# Patient Record
Sex: Male | Born: 1975
Health system: Southern US, Community
[De-identification: ages and names within clinical notes are randomized; demographics above are authoritative.]

## PROBLEM LIST (undated history)

## (undated) DIAGNOSIS — G43909 Migraine, unspecified, not intractable, without status migrainosus: Secondary | ICD-10-CM

## (undated) DIAGNOSIS — E785 Hyperlipidemia, unspecified: Secondary | ICD-10-CM

## (undated) HISTORY — DX: Hyperlipidemia, unspecified: E78.5

## (undated) HISTORY — DX: Migraine, unspecified, not intractable, without status migrainosus: G43.909

---

## 2010-08-04 ENCOUNTER — Encounter: Payer: Self-pay | Admitting: Preventative Medicine

## 2012-12-29 ENCOUNTER — Ambulatory Visit (INDEPENDENT_AMBULATORY_CARE_PROVIDER_SITE_OTHER): Payer: BC Managed Care – PPO | Admitting: Family Medicine

## 2012-12-29 ENCOUNTER — Encounter: Payer: Self-pay | Admitting: Family Medicine

## 2012-12-29 VITALS — BP 123/78 | HR 69 | Temp 98.3°F | Ht 67.0 in | Wt 169.2 lb

## 2012-12-29 DIAGNOSIS — Z0000 Encounter for general adult medical examination without abnormal findings: Secondary | ICD-10-CM

## 2012-12-29 DIAGNOSIS — R7309 Other abnormal glucose: Secondary | ICD-10-CM

## 2012-12-29 DIAGNOSIS — R7989 Other specified abnormal findings of blood chemistry: Secondary | ICD-10-CM

## 2012-12-29 DIAGNOSIS — E790 Hyperuricemia without signs of inflammatory arthritis and tophaceous disease: Secondary | ICD-10-CM

## 2012-12-29 DIAGNOSIS — R739 Hyperglycemia, unspecified: Secondary | ICD-10-CM

## 2012-12-29 NOTE — Progress Notes (Signed)
  Subjective:    Patient ID: Ronald Medina, male    DOB: 1976-01-28, 37 y.o.   MRN: [id number]  HPI This 37 y.o. male presents for evaluation of  Work physical. He works at [employer].  He has had labs and his potassium is mildly elevated at 5.2, his ggt is slightly elevated at 66, and his uric acid is elevated at 7.7.  He does admit to drinking 2 beers a day.  He does not have any other medical problems.  He is otherwise healthy.   Review of Systems  Constitutional: Negative.   HENT: Negative.   Eyes: Negative.   Respiratory: Negative.   Cardiovascular: Negative.   Gastrointestinal: Negative.   Genitourinary: Negative.   Musculoskeletal: Negative.        Objective:   Physical Exam  Vital signs noted  Well developed well nourished male.  HEENT - Head atraumatic Normocephalic                Eyes - PERRLA, Conjuctiva - clear Sclera- Clear EOMI                Ears - EAC's Wnl TM's Wnl Gross Hearing WNL                Nose - Nares patent                 Throat - oropharanx wnl Respiratory - Lungs CTA bilateral Cardiac - RRR S1 and S2 without murmur GI - Abdomen soft Nontender and bowel sounds active x 4 Extremities - No edema. Neuro - Grossly intact.      Assessment & Plan:  Routine general medical examination at a health care facility Recommend he get annual exam and labs.  Recommend annual dental exam and eye exam.  Hyperuricemia- DC ETOH for a week and repeat Uric acid and follow low purine diet.  Hyperglycemia - Repeat fasting glucose  Elevated LFTs - Abstain from ETOH for week and repeat LFT and GGT.  Hyperkalemia - Repeat CMP in a week.

## 2012-12-29 NOTE — Patient Instructions (Signed)
Hyperkalemia  Hyperkalemia is when you have too much potassium in your blood. This can be a life-threatening condition. Potassium is normally removed (excreted) from the body by the kidneys.  CAUSES   The potassium level in your body can become too high for the following reasons:   You take in too much potassium. You can do this by:   Using salt substitutes. They contain large amounts of potassium.   Taking potassium supplements from your caregiver. The dose may be too high for you.   Eating foods or taking nutritional products with potassium.   You excrete too little potassium. This can happen if:   Your kidneys are not functioning properly. Kidney (renal) disease is a very common cause of hyperkalemia.   You are taking medicines that lower your excretion of potassium, such as certain diuretic medicines.   You have an adrenal gland disease called Addison's disease.   You have a urinary tract obstruction, such as kidney stones.   You are on treatment to mechanically clean your blood (dialysis) and you skip a treatment.   You release a high amount of potassium from your cells into your blood. You may have a condition that causes potassium to move from your cells to your bloodstream. This can happen with:   Injury to muscles or other tissues. Most potassium is stored in the muscles.   Severe burns or infections.   Acidic blood plasma (acidosis). Acidosis can result from many diseases, such as uncontrolled diabetes.  SYMPTOMS   Usually, there are no symptoms unless the potassium is dangerously high or has risen very quickly. Symptoms may include:   Irregular or very slow heartbeat.   Feeling sick to your stomach (nauseous).   Tiredness (fatigue).   Nerve problems such as tingling of the skin, numbness of the hands or feet, weakness, or paralysis.  DIAGNOSIS   A simple blood test can measure the amount of potassium in your body. An electrocardiogram test of the heart can also help make the diagnosis.  The heart may beat dangerously fast or slow down and stop beating with severe hyperkalemia.   TREATMENT   Treatment depends on how bad the condition is and on the underlying cause.   If the hyperkalemia is an emergency (causing heart problems or paralysis), many different medicines can be used alone or together to lower the potassium level briefly. This may include an insulin injection even if you are not diabetic. Emergency dialysis may be needed to remove potassium from the body.   If the hyperkalemia is less severe or dangerous, the underlying cause is treated. This can include taking medicines if needed. Your prescription medicines may be changed. You may also need to take a medicine to help your body get rid of potassium. You may need to eat a diet low in potassium.  HOME CARE INSTRUCTIONS    Take medicines and supplements as directed by your caregiver.   Do not take any over-the-counter medicines, supplements, natural products, herbs, or vitamins without reviewing them with your caregiver. Certain supplements and natural food products can have high amounts of potassium. Other products (such as ibuprofen) can damage weak kidneys and raise your potassium.   You may be asked to do repeat lab tests. Be sure to follow these directions.   If you have kidney disease, you may need to follow a low potassium diet.  SEEK MEDICAL CARE IF:    You notice an irregular or very slow heartbeat.   You feel lightheaded.     You develop weakness that is unusual for you.  SEEK IMMEDIATE MEDICAL CARE IF:    You have shortness of breath.   You have chest discomfort.   You pass out (faint).  MAKE SURE YOU:    Understand these instructions.   Will watch your condition.   Will get help right away if you are not doing well or get worse.  Document Released: 06/20/2002 Document Revised: 09/22/2011 Document Reviewed: 12/05/2010  [employer] Patient Information 2014 [employer].

## 2014-06-14 ENCOUNTER — Encounter: Payer: Self-pay | Admitting: Family Medicine

## 2014-06-14 ENCOUNTER — Ambulatory Visit (INDEPENDENT_AMBULATORY_CARE_PROVIDER_SITE_OTHER): Payer: BC Managed Care – PPO | Admitting: Family Medicine

## 2014-06-14 ENCOUNTER — Encounter (INDEPENDENT_AMBULATORY_CARE_PROVIDER_SITE_OTHER): Payer: Self-pay

## 2014-06-14 VITALS — BP 135/87 | HR 60 | Temp 97.5°F | Ht 67.0 in | Wt 190.8 lb

## 2014-06-14 DIAGNOSIS — Z0000 Encounter for general adult medical examination without abnormal findings: Secondary | ICD-10-CM

## 2014-06-14 NOTE — Progress Notes (Signed)
   Subjective:    Patient ID: Ronald Medina, male    DOB: 07/18/1975, 38 y.o.   MRN: [id number]  HPI 38 year old gentleman here for annual physical. He brings in some lab work that he had done at work basically it's okay except for elevated uric acid and his SGPT is mildly elevated with other liver function enzymes being normal. He has no complaints. He does have a family history of hypertension.    Review of Systems  Constitutional: Negative.   HENT: Negative.   Eyes: Negative.   Respiratory: Negative.  Negative for shortness of breath.   Cardiovascular: Negative.  Negative for chest pain and leg swelling.  Gastrointestinal: Negative.   Genitourinary: Negative.   Musculoskeletal: Negative.   Skin: Negative.   Neurological: Negative.   Psychiatric/Behavioral: Negative.   All other systems reviewed and are negative.      Objective:   Physical Exam  Constitutional: He is oriented to person, place, and time. He appears well-developed and well-nourished.  HENT:  Head: Normocephalic.  Right Ear: External ear normal.  Left Ear: External ear normal.  Nose: Nose normal.  Mouth/Throat: Oropharynx is clear and moist.  Eyes: Conjunctivae and EOM are normal. Pupils are equal, round, and reactive to light.  Neck: Normal range of motion. Neck supple.  Cardiovascular: Normal rate, regular rhythm, normal heart sounds and intact distal pulses.   Pulmonary/Chest: Effort normal and breath sounds normal.  Abdominal: Soft. Bowel sounds are normal.  Musculoskeletal: Normal range of motion.  Neurological: He is alert and oriented to person, place, and time.  Skin: Skin is warm and dry.  Psychiatric: He has a normal mood and affect. His behavior is normal. Judgment and thought content normal.          Assessment & Plan:  BP 135/87 mmHg  Pulse 60  Temp(Src) 97.5 F (36.4 C) (Oral)  Ht 5\' 7"  (1.702 m)  Wt 190 lb 12.8 oz (86.546 kg)  BMI 29.88 kg/m2  1. Annual physical exam Normal exam but  rec monitoring BP.  If he develops gout, may want to consider uricosuric drug  Frederica KusterStephen M Mehlani Blankenburg MD

## 2015-01-04 ENCOUNTER — Encounter: Payer: Self-pay | Admitting: Family Medicine

## 2015-01-04 ENCOUNTER — Ambulatory Visit (INDEPENDENT_AMBULATORY_CARE_PROVIDER_SITE_OTHER): Payer: BLUE CROSS/BLUE SHIELD | Admitting: Family Medicine

## 2015-01-04 VITALS — BP 130/81 | HR 60 | Temp 97.8°F | Ht 67.0 in | Wt 185.4 lb

## 2015-01-04 DIAGNOSIS — R519 Headache, unspecified: Secondary | ICD-10-CM

## 2015-01-04 DIAGNOSIS — R51 Headache: Secondary | ICD-10-CM

## 2015-01-04 MED ORDER — FROVATRIPTAN SUCCINATE 2.5 MG PO TABS: 2.5000 mg | ORAL_TABLET | ORAL | Status: DC | PRN

## 2015-01-04 MED ORDER — TOPIRAMATE 100 MG PO TABS: 100.0000 mg | ORAL_TABLET | Freq: Two times a day (BID) | ORAL | Status: DC

## 2015-01-04 MED ORDER — TOPIRAMATE 100 MG PO TABS: 100.0000 mg | ORAL_TABLET | Freq: Every day | ORAL | Status: DC

## 2015-01-04 NOTE — Progress Notes (Signed)
Subjective:  Patient ID: Ronald Medina, male    DOB: 10-05-1975  Age: 39 y.o. MRN: [id number]  CC: Headache   HPI Ronald Medina presents for first cluster in 7-8 yrs. Onset 2 weeks ago. Occur q 3-4 day. Alcohol triggers during a cluster.Poor relief in past with imitrex. R hemicranium. 7/10 pain scale. Last several hours. HEad sore the next day. Light headed. History Ronald Medina has no past medical history on file.   He has no past surgical history on file.   His family history includes Healthy in his brother, father, mother, and sister.He reports that he has quit smoking. He does not have any smokeless tobacco history on file. He reports that he drinks alcohol. He reports that he does not use illicit drugs.  No outpatient prescriptions prior to visit.   No facility-administered medications prior to visit.    ROS Review of Systems  Constitutional: Negative for fever, chills, diaphoresis and unexpected weight change.  HENT: Negative for congestion, hearing loss, rhinorrhea, sore throat and trouble swallowing.   Gastrointestinal: Negative for nausea, vomiting, abdominal pain, diarrhea, constipation and abdominal distention.  Endocrine: Negative for heat intolerance.  Genitourinary: Negative for dysuria, hematuria and flank pain.  Musculoskeletal: Negative for joint swelling and arthralgias.  Skin: Negative for rash.  Neurological: Negative for dizziness and headaches.  Psychiatric/Behavioral: Negative for dysphoric mood, decreased concentration and agitation. The patient is not nervous/anxious.     Objective:  BP 130/81 mmHg  Pulse 60  Temp(Src) 97.8 F (36.6 C) (Oral)  Ht  (1.702 m)  Wt 185 lb 6.4 oz (84.097 kg)  BMI 29.03 kg/m2  BP Readings from Last 3 Encounters:  01/04/15 130/81  06/14/14 135/87  12/29/12 123/78    Wt Readings from Last 3 Encounters:  01/04/15 185 lb 6.4 oz (84.097 kg)  06/14/14 190 lb 12.8 oz (86.546 kg)  12/29/12 169 lb 3.2 oz (76.749 kg)      Physical Exam  Constitutional: He is oriented to person, place, and time. He appears well-developed and well-nourished. No distress.  HENT:  Head: Normocephalic and atraumatic.  Right Ear: External ear normal.  Left Ear: External ear normal.  Nose: Nose normal.  Mouth/Throat: Oropharynx is clear and moist.  Eyes: Conjunctivae and EOM are normal. Pupils are equal, round, and reactive to light.  Neck: Normal range of motion. Neck supple. No thyromegaly present.  Cardiovascular: Normal rate, regular rhythm and normal heart sounds.   No murmur heard. Pulmonary/Chest: Effort normal and breath sounds normal. No respiratory distress. He has no wheezes. He has no rales.  Abdominal: Soft. Bowel sounds are normal. He exhibits no distension. There is no tenderness.  Lymphadenopathy:    He has no cervical adenopathy.  Neurological: He is alert and oriented to person, place, and time. He has normal reflexes.  Skin: Skin is warm and dry.  Psychiatric: He has a normal mood and affect. His behavior is normal. Judgment and thought content normal.    No results found for: HGBA1C  No results found for: WBC, HGB, HCT, PLT, GLUCOSE, CHOL, TRIG, HDL, LDLDIRECT, LDLCALC, ALT, AST, NA, K, CL, CREATININE, BUN, CO2, TSH, PSA, INR, GLUF, HGBA1C, MICROALBUR  Patient was never admitted.  Assessment & Plan:   Ronald Medina was seen today for headache.  Diagnoses and all orders for this visit:  Headache disorder  Other orders -     Discontinue: topiramate (TOPAMAX) 100 MG tablet; Take 1 tablet (100 mg total) by mouth 2 (two) times daily. -  frovatriptan (FROVA) 2.5 MG tablet; Take 1 tablet (2.5 mg total) by mouth as needed for migraine. If recurs, may repeat after 2 hours. Max of 3 tabs in 24 hours. -     topiramate (TOPAMAX) 100 MG tablet; Take 1 tablet (100 mg total) by mouth at bedtime.  I have changed Mr. Ronald Medina's topiramate. I am also having him start on frovatriptan.  Meds ordered this encounter   Medications  . DISCONTD: topiramate (TOPAMAX) 100 MG tablet    Sig: Take 1 tablet (100 mg total) by mouth 2 (two) times daily.    Dispense:  30 tablet    Refill:  2  . frovatriptan (FROVA) 2.5 MG tablet    Sig: Take 1 tablet (2.5 mg total) by mouth as needed for migraine. If recurs, may repeat after 2 hours. Max of 3 tabs in 24 hours.    Dispense:  10 tablet    Refill:  0  . topiramate (TOPAMAX) 100 MG tablet    Sig: Take 1 tablet (100 mg total) by mouth at bedtime.    Dispense:  30 tablet    Refill:  2    Cancel previous BID scrip. Thanks, WS     Follow-up: Return in about 1 month (around 02/03/2015).  Ronald Medina, M.D.

## 2015-01-10 ENCOUNTER — Other Ambulatory Visit: Payer: Self-pay | Admitting: Family Medicine

## 2015-04-25 ENCOUNTER — Ambulatory Visit (INDEPENDENT_AMBULATORY_CARE_PROVIDER_SITE_OTHER): Payer: BLUE CROSS/BLUE SHIELD | Admitting: Family Medicine

## 2015-04-25 ENCOUNTER — Encounter: Payer: Self-pay | Admitting: Family Medicine

## 2015-04-25 VITALS — BP 137/88 | HR 91 | Temp 97.2°F | Ht 67.0 in | Wt 191.6 lb

## 2015-04-25 DIAGNOSIS — J309 Allergic rhinitis, unspecified: Secondary | ICD-10-CM

## 2015-04-25 MED ORDER — MOMETASONE FUROATE 50 MCG/ACT NA SUSP: 1.0000 | Freq: Two times a day (BID) | NASAL | Status: DC | PRN

## 2015-04-25 NOTE — Progress Notes (Signed)
BP 137/88 mmHg  Pulse 91  Temp(Src) 97.2 F (36.2 C) (Oral)  Ht  (1.702 m)  Wt 191 lb 9.6 oz (86.909 kg)  BMI 30.00 kg/m2   Subjective:    Patient ID: Ronald Medina, male    DOB: [birth year], 39 y.o.   MRN: [id number]  HPI: Ronald Medina is a 39 y.o. male presenting on 04/25/2015 for Cough and Nasal Congestion   HPI Cough and nasal congestion Patient has been having cough and nasal congestion in the past 2 weeks. He also complains of sinus pressure and postnasal drainage and a tickling feeling in his throat that is worse at night and in the morning. He denies any fevers or chills and was wondering whether this would be allergies or another type of infection. Sinus pressure is mostly on the frontal region.  Relevant past medical, surgical, family and social history reviewed and updated as indicated. Interim medical history since our last visit reviewed. Allergies and medications reviewed and updated.  Review of Systems  Constitutional: Positive for fever. Negative for chills.  HENT: Positive for congestion, postnasal drip, rhinorrhea, sinus pressure, sneezing and sore throat. Negative for ear discharge, ear pain and voice change.   Eyes: Negative for pain, discharge, redness and visual disturbance.  Respiratory: Positive for cough. Negative for chest tightness, shortness of breath and wheezing.   Cardiovascular: Negative for chest pain and leg swelling.  Gastrointestinal: Negative for abdominal pain, diarrhea and constipation.  Genitourinary: Negative for difficulty urinating.  Musculoskeletal: Negative for back pain and gait problem.  Skin: Negative for rash.  Neurological: Negative for syncope, light-headedness and headaches.  All other systems reviewed and are negative.   Per HPI unless specifically indicated above     Medication List       This list is accurate as of: 04/25/15  4:28 PM.  Always use your most recent med list.               frovatriptan 2.5 MG  tablet  Commonly known as:  FROVA  TAKE 1 TAB BY MOUTH AS NEEDED FOR MIGRAINE. IF RECURS, MAY REPEAT IN 2HOURS. MAX OF 3TABS IN 24HOURS     mometasone 50 MCG/ACT nasal spray  Commonly known as:  NASONEX  Place 1 spray into the nose 2 (two) times daily as needed.           Objective:    BP 137/88 mmHg  Pulse 91  Temp(Src) 97.2 F (36.2 C) (Oral)  Ht  (1.702 m)  Wt 191 lb 9.6 oz (86.909 kg)  BMI 30.00 kg/m2  Wt Readings from Last 3 Encounters:  04/25/15 191 lb 9.6 oz (86.909 kg)  01/04/15 185 lb 6.4 oz (84.097 kg)  06/14/14 190 lb 12.8 oz (86.546 kg)    Physical Exam  Constitutional: He is oriented to person, place, and time. He appears well-developed and well-nourished. No distress.  HENT:  Right Ear: Tympanic membrane, external ear and ear canal normal.  Left Ear: Tympanic membrane, external ear and ear canal normal.  Nose: Mucosal edema and rhinorrhea present. No sinus tenderness. No epistaxis. Right sinus exhibits frontal sinus tenderness. Right sinus exhibits no maxillary sinus tenderness. Left sinus exhibits frontal sinus tenderness. Left sinus exhibits no maxillary sinus tenderness.  Mouth/Throat: Uvula is midline and mucous membranes are normal. Posterior oropharyngeal edema and posterior oropharyngeal erythema present. No oropharyngeal exudate or tonsillar abscesses.  Eyes: Conjunctivae and EOM are normal. Pupils are equal, round, and reactive to light. Right  eye exhibits no discharge. No scleral icterus.  Neck: Neck supple. No thyromegaly present.  Cardiovascular: Normal rate, regular rhythm, normal heart sounds and intact distal pulses.   No murmur heard. Pulmonary/Chest: Effort normal and breath sounds normal. No respiratory distress. He has no wheezes.  Abdominal: He exhibits no distension.  Musculoskeletal: Normal range of motion. He exhibits no edema.  Lymphadenopathy:    He has no cervical adenopathy.  Neurological: He is alert and oriented to person,  place, and time. Coordination normal.  Skin: Skin is warm and dry. No rash noted. He is not diaphoretic.  Psychiatric: He has a normal mood and affect. His behavior is normal.  Vitals reviewed.   No results found for this or any previous visit.    Assessment & Plan:   Problem List Items Addressed This Visit    None    Visit Diagnoses    Allergic rhinitis, unspecified allergic rhinitis type    -  Primary    Sounds like allergic rhinitis especially with the season. We'll try Nasonex and an antihistamine    Relevant Medications    mometasone (NASONEX) 50 MCG/ACT nasal spray        Follow up plan: Return in about 1 week (around 05/02/2015), or if symptoms worsen or fail to improve, for Erectile dysfunction.  Arville CareJoshua Dettinger, MD [facility] 04/25/2015, 4:28 PM

## 2015-04-30 ENCOUNTER — Ambulatory Visit (INDEPENDENT_AMBULATORY_CARE_PROVIDER_SITE_OTHER): Payer: BLUE CROSS/BLUE SHIELD | Admitting: Family Medicine

## 2015-04-30 ENCOUNTER — Encounter: Payer: Self-pay | Admitting: Family Medicine

## 2015-04-30 VITALS — BP 131/81 | HR 89 | Temp 97.2°F | Ht 67.0 in | Wt 189.4 lb

## 2015-04-30 DIAGNOSIS — R7301 Impaired fasting glucose: Secondary | ICD-10-CM | POA: Diagnosis not present

## 2015-04-30 DIAGNOSIS — N529 Male erectile dysfunction, unspecified: Secondary | ICD-10-CM | POA: Diagnosis not present

## 2015-04-30 DIAGNOSIS — E785 Hyperlipidemia, unspecified: Secondary | ICD-10-CM | POA: Diagnosis not present

## 2015-04-30 LAB — POCT GLYCOSYLATED HEMOGLOBIN (HGB A1C): HEMOGLOBIN A1C: 5.2

## 2015-04-30 MED ORDER — SILDENAFIL CITRATE 100 MG PO TABS: 50.0000 mg | ORAL_TABLET | Freq: Every day | ORAL | Status: DC | PRN

## 2015-04-30 MED ORDER — ATORVASTATIN CALCIUM 20 MG PO TABS: 20.0000 mg | ORAL_TABLET | Freq: Every day | ORAL | Status: DC

## 2015-04-30 NOTE — Progress Notes (Signed)
BP 131/81 mmHg  Pulse 89  Temp(Src) 97.2 F (36.2 C) (Oral)  Ht  (1.702 m)  Wt 189 lb 6.4 oz (85.911 kg)  BMI 29.66 kg/m2   Subjective:    Patient ID: Ronald Medina, male    DOB: [birth year], 39 y.o.   MRN: [id number]  HPI: Ronald Medina is a 39 y.o. male presenting on 04/30/2015 for Followup URI   HPI Elevated fasting glucose  Patient presents today with elevated fasting glucose of 109. He had his labs done through his work and he brings that sheet in to be reviewed by me today. He also had labs testing for kidney function, liver function, thyroid, prostate and they were all normal. He denies any polyuria or dysuria or chest pain or shortness of breath.  Erectile dysfunction Patient has been having difficulty with erections, he can sometimes get an erection and sometimes not. His biggest issue though is he has trouble maintaining erections. He would like to try a medication to help with that. He denies any penile or testicular pain or penile discharge.  Hyperlipidemia Patient has elevated LDL in the 170s on his labs, his triglycerides were also in the 180s. From these labs realize he needs to do some dietary changes and lifestyle changes such as exercise. He does have a family history of heart disease in some of his uncles but not in his immediate family.  Relevant past medical, surgical, family and social history reviewed and updated as indicated. Interim medical history since our last visit reviewed. Allergies and medications reviewed and updated.  Review of Systems  Constitutional: Negative for fever.  HENT: Negative for ear discharge and ear pain.   Eyes: Negative for discharge and visual disturbance.  Respiratory: Negative for shortness of breath and wheezing.   Cardiovascular: Negative for chest pain and leg swelling.  Gastrointestinal: Negative for abdominal pain, diarrhea and constipation.  Genitourinary: Negative for discharge, penile swelling, difficulty urinating,  genital sores, penile pain and testicular pain.  Musculoskeletal: Negative for back pain and gait problem.  Skin: Negative for rash.  Neurological: Negative for syncope, light-headedness and headaches.  All other systems reviewed and are negative.   Per HPI unless specifically indicated above     Medication List       This list is accurate as of: 04/30/15  6:05 PM.  Always use your most recent med list.               atorvastatin 20 MG tablet  Commonly known as:  LIPITOR  Take 1 tablet (20 mg total) by mouth daily.     frovatriptan 2.5 MG tablet  Commonly known as:  FROVA  TAKE 1 TAB BY MOUTH AS NEEDED FOR MIGRAINE. IF RECURS, MAY REPEAT IN 2HOURS. MAX OF 3TABS IN 24HOURS     mometasone 50 MCG/ACT nasal spray  Commonly known as:  NASONEX  Place 1 spray into the nose 2 (two) times daily as needed.     sildenafil 100 MG tablet  Commonly known as:  VIAGRA  Take 0.5-1 tablets (50-100 mg total) by mouth daily as needed for erectile dysfunction.           Objective:    BP 131/81 mmHg  Pulse 89  Temp(Src) 97.2 F (36.2 C) (Oral)  Ht  (1.702 m)  Wt 189 lb 6.4 oz (85.911 kg)  BMI 29.66 kg/m2  Wt Readings from Last 3 Encounters:  04/30/15 189 lb 6.4 oz (85.911 kg)  04/25/15 191  lb 9.6 oz (86.909 kg)  01/04/15 185 lb 6.4 oz (84.097 kg)    Physical Exam  Constitutional: He is oriented to person, place, and time. He appears well-developed and well-nourished. No distress.  Eyes: Conjunctivae and EOM are normal. Pupils are equal, round, and reactive to light. Right eye exhibits no discharge. No scleral icterus.  Cardiovascular: Normal rate, regular rhythm, normal heart sounds and intact distal pulses.   No murmur heard. Pulmonary/Chest: Effort normal and breath sounds normal. No respiratory distress. He has no wheezes.  Abdominal: He exhibits no distension.  Musculoskeletal: Normal range of motion. He exhibits no edema.  Neurological: He is alert and oriented to  person, place, and time. Coordination normal.  Skin: Skin is warm and dry. No rash noted. He is not diaphoretic.  Psychiatric: He has a normal mood and affect. His behavior is normal.  Vitals reviewed.   No results found for this or any previous visit.    Assessment & Plan:   Problem List Items Addressed This Visit    None    Visit Diagnoses    Elevated fasting glucose    -  Primary    Fasting glucose elevated on labs he did at work. We will check an A1c    Relevant Orders    POCT glycosylated hemoglobin (Hb A1C) (Completed)    Erectile dysfunction, unspecified erectile dysfunction type        Gave samples for Cialis but sent a prescription for Viagra and he will try them    Relevant Medications    sildenafil (VIAGRA) 100 MG tablet    Hyperlipidemia LDL goal <130        LDL and triglycerides elevated on labs he didn't work, will start atorvastatin    Relevant Medications    atorvastatin (LIPITOR) 20 MG tablet    sildenafil (VIAGRA) 100 MG tablet        Follow up plan: Return in about 6 months (around 10/29/2015), or if symptoms worsen or fail to improve, for hyperlipidemia.  Arville CareJoshua Dettinger, MD [facility] 04/30/2015, 6:05 PM

## 2015-05-23 ENCOUNTER — Ambulatory Visit (INDEPENDENT_AMBULATORY_CARE_PROVIDER_SITE_OTHER): Payer: BLUE CROSS/BLUE SHIELD | Admitting: Family Medicine

## 2015-05-23 VITALS — BP 136/89 | HR 81 | Temp 97.8°F | Ht 67.0 in | Wt 189.0 lb

## 2015-05-23 DIAGNOSIS — J208 Acute bronchitis due to other specified organisms: Secondary | ICD-10-CM

## 2015-05-23 MED ORDER — AZITHROMYCIN 250 MG PO TABS: ORAL_TABLET | ORAL | Status: DC

## 2015-05-23 MED ORDER — BENZONATATE 100 MG PO CAPS: 100.0000 mg | ORAL_CAPSULE | Freq: Two times a day (BID) | ORAL | Status: DC | PRN

## 2015-05-23 NOTE — Progress Notes (Signed)
   HPI  Patient presents today here for evaluation of cough.  Patient's had a cough over the last month. Has almost completely resolved and then returned over the last 4 days. He states he also has chest tightness and nasal congestion over the last 4 days. He had some improvement previously as an accident has used a little bit lately. He denies dyspnea, chest pain, fever, malaise. He has not been missing any work. His normal appetite and oral tolerance.  Describes a mostly dry cough with intermittent yellow sputum production  PMH: Smoking status noted ROS: Per HPI  Objective: BP 136/89 mmHg  Pulse 81  Temp(Src) 97.8 F (36.6 C) (Oral)  Ht 5\' 7"  (1.702 m)  Wt 189 lb (85.73 kg)  BMI 29.59 kg/m2 Gen: NAD, alert, cooperative with exam HEENT: NCAT, TMs normal bilaterally, nares clear, oropharynx clear CV: RRR, good S1/S2, no murmur Resp: CTABL, no wheezes, non-labored Ext: No edema, warm Neuro: Alert and oriented, No gross deficits  Assessment and plan:  # Acute bronchitis Considering resolution of previous cough and then return with chest tightness over the last 4-5 days ahead and treat as a possible bacterial process with azithromycin Discussed reasons to return for care including failure to improve. Recommended consistent Nasonex use Tessalon Perles for cough as well   Meds ordered this encounter  Medications  . azithromycin (ZITHROMAX) 250 MG tablet    Sig: Take 2 tablets on day 1 and 1 tablet daily after that    Dispense:  6 tablet    Refill:  0  . benzonatate (TESSALON) 100 MG capsule    Sig: Take 1 capsule (100 mg total) by mouth 2 (two) times daily as needed for cough.    Dispense:  20 capsule    Refill:  0    Murtis SinkSam Latissa Frick, MD [facility] 05/23/2015, 5:31 PM

## 2015-05-23 NOTE — Patient Instructions (Signed)
Great to meet you!  Take the antibiotics Use tessalon for cough  Use the nasonex everyday  Acute Bronchitis Bronchitis is inflammation of the airways that extend from the windpipe into the lungs (bronchi). The inflammation often causes mucus to develop. This leads to a cough, which is the most common symptom of bronchitis.  In acute bronchitis, the condition usually develops suddenly and goes away over time, usually in a couple weeks. Smoking, allergies, and asthma can make bronchitis worse. Repeated episodes of bronchitis may cause further lung problems.  CAUSES Acute bronchitis is most often caused by the same virus that causes a cold. The virus can spread from person to person (contagious) through coughing, sneezing, and touching contaminated objects. SIGNS AND SYMPTOMS   Cough.   Fever.   Coughing up mucus.   Body aches.   Chest congestion.   Chills.   Shortness of breath.   Sore throat.  DIAGNOSIS  Acute bronchitis is usually diagnosed through a physical exam. Your health care provider will also ask you questions about your medical history. Tests, such as chest X-rays, are sometimes done to rule out other conditions.  TREATMENT  Acute bronchitis usually goes away in a couple weeks. Oftentimes, no medical treatment is necessary. Medicines are sometimes given for relief of fever or cough. Antibiotic medicines are usually not needed but may be prescribed in certain situations. In some cases, an inhaler may be recommended to help reduce shortness of breath and control the cough. A cool mist vaporizer may also be used to help thin bronchial secretions and make it easier to clear the chest.  HOME CARE INSTRUCTIONS  Get plenty of rest.   Drink enough fluids to keep your urine clear or pale yellow (unless you have a medical condition that requires fluid restriction). Increasing fluids may help thin your respiratory secretions (sputum) and reduce chest congestion, and it  will prevent dehydration.   Take medicines only as directed by your health care provider.  If you were prescribed an antibiotic medicine, finish it all even if you start to feel better.  Avoid smoking and secondhand smoke. Exposure to cigarette smoke or irritating chemicals will make bronchitis worse. If you are a smoker, consider using nicotine gum or skin patches to help control withdrawal symptoms. Quitting smoking will help your lungs heal faster.   Reduce the chances of another bout of acute bronchitis by washing your hands frequently, avoiding people with cold symptoms, and trying not to touch your hands to your mouth, nose, or eyes.   Keep all follow-up visits as directed by your health care provider.  SEEK MEDICAL CARE IF: Your symptoms do not improve after 1 week of treatment.  SEEK IMMEDIATE MEDICAL CARE IF:  You develop an increased fever or chills.   You have chest pain.   You have severe shortness of breath.  You have bloody sputum.   You develop dehydration.  You faint or repeatedly feel like you are going to pass out.  You develop repeated vomiting.  You develop a severe headache. MAKE SURE YOU:   Understand these instructions.  Will watch your condition.  Will get help right away if you are not doing well or get worse.   This information is not intended to replace advice given to you by your health care provider. Make sure you discuss any questions you have with your health care provider.   Document Released: 08/07/2004 Document Revised: 07/21/2014 Document Reviewed: 12/21/2012 Elsevier Interactive Patient Education [employer]2016 Elsevier Inc.

## 2015-06-05 ENCOUNTER — Ambulatory Visit (INDEPENDENT_AMBULATORY_CARE_PROVIDER_SITE_OTHER): Payer: BLUE CROSS/BLUE SHIELD

## 2015-06-05 ENCOUNTER — Ambulatory Visit (INDEPENDENT_AMBULATORY_CARE_PROVIDER_SITE_OTHER): Payer: BLUE CROSS/BLUE SHIELD | Admitting: Family Medicine

## 2015-06-05 ENCOUNTER — Encounter: Payer: Self-pay | Admitting: Family Medicine

## 2015-06-05 ENCOUNTER — Encounter (INDEPENDENT_AMBULATORY_CARE_PROVIDER_SITE_OTHER): Payer: Self-pay

## 2015-06-05 VITALS — BP 140/79 | HR 104 | Temp 97.1°F | Ht 67.0 in | Wt 181.6 lb

## 2015-06-05 DIAGNOSIS — M79642 Pain in left hand: Secondary | ICD-10-CM | POA: Diagnosis not present

## 2015-06-05 DIAGNOSIS — M25561 Pain in right knee: Secondary | ICD-10-CM

## 2015-06-05 DIAGNOSIS — R030 Elevated blood-pressure reading, without diagnosis of hypertension: Secondary | ICD-10-CM | POA: Diagnosis not present

## 2015-06-05 DIAGNOSIS — E785 Hyperlipidemia, unspecified: Secondary | ICD-10-CM | POA: Diagnosis not present

## 2015-06-05 NOTE — Progress Notes (Signed)
Subjective:  Patient ID: Ronald Medina, male    DOB: 06/18/1976  Age: 39 y.o. MRN: [id number]  CC: Knee Pain   HPI Ronald Medina presents for recurrent right knee pain. Had a shot in it 3-4 years ago. Feels like a toothache. Tight in back. No XR done. Flares occasionally  In past and responds to ibuprofen. This time he has not had relief with OTC's. Pain is 6/10.   Had metal pully fall on left hand a month ago. Painful at first. Now he can't  Flex at 5th DIP left hand.Plays guitar. Can't play due to inability of 5th finger to flex.  Has had elevated BP on several occasions, up and down for a year or two. Doesn't eat right. Would like to avoid more medication. Willing to modify diet and lose weight.  Patient in for follow-up of elevated cholesterol. Doing well without complaints on current medication. Denies side effects of statin including myalgia and arthralgia and nausea. Also in today for liver function testing. Currently no chest pain, shortness of breath or other cardiovascular related symptoms noted.  History Ronald Medina has a past medical history of Migraines.   He has no past surgical history on file.   His family history includes Healthy in his brother, father, mother, and sister.He reports that he has quit smoking. He does not have any smokeless tobacco history on file. He reports that he drinks alcohol. He reports that he does not use illicit drugs.  Outpatient Prescriptions Prior to Visit  Medication Sig Dispense Refill  . atorvastatin (LIPITOR) 20 MG tablet Take 1 tablet (20 mg total) by mouth daily. 90 tablet 3  . mometasone (NASONEX) 50 MCG/ACT nasal spray Place 1 spray into the nose 2 (two) times daily as needed. 17 g 12  . sildenafil (VIAGRA) 100 MG tablet Take 0.5-1 tablets (50-100 mg total) by mouth daily as needed for erectile dysfunction. 5 tablet 11  . azithromycin (ZITHROMAX) 250 MG tablet Take 2 tablets on day 1 and 1 tablet daily after that (Patient not taking: Reported on  06/05/2015) 6 tablet 0  . benzonatate (TESSALON) 100 MG capsule Take 1 capsule (100 mg total) by mouth 2 (two) times daily as needed for cough. (Patient not taking: Reported on 06/05/2015) 20 capsule 0  . frovatriptan (FROVA) 2.5 MG tablet TAKE 1 TAB BY MOUTH AS NEEDED FOR MIGRAINE. IF RECURS, MAY REPEAT IN 2HOURS. MAX OF 3TABS IN 24HOURS (Patient not taking: Reported on 05/23/2015) 9 tablet 0   No facility-administered medications prior to visit.    ROS Review of Systems  Constitutional: Negative for fever, chills, diaphoresis and unexpected weight change.  HENT: Negative for congestion, hearing loss, rhinorrhea and sore throat.   Eyes: Negative for visual disturbance.  Respiratory: Negative for cough and shortness of breath.   Cardiovascular: Negative for chest pain.  Gastrointestinal: Negative for abdominal pain, diarrhea and constipation.  Genitourinary: Negative for dysuria and flank pain.  Musculoskeletal: Positive for myalgias and arthralgias.  Skin: Negative for rash.  Neurological: Positive for headaches. Negative for dizziness.  Psychiatric/Behavioral: Negative for sleep disturbance and dysphoric mood.    Objective:  BP 140/79 mmHg  Pulse 104  Temp(Src) 97.1 F (36.2 C) (Oral)  Ht 5\' 7"  (1.702 m)  Wt 181 lb 9.6 oz (82.373 kg)  BMI 28.44 kg/m2  SpO2 98%  BP Readings from Last 3 Encounters:  06/05/15 140/79  05/23/15 136/89  04/30/15 131/81    Wt Readings from Last 3 Encounters:  06/05/15  181 lb 9.6 oz (82.373 kg)  05/23/15 189 lb (85.73 kg)  04/30/15 189 lb 6.4 oz (85.911 kg)     Physical Exam  Lab Results  Component Value Date   HGBA1C 5.2 04/30/2015    Lab Results  Component Value Date   HGBA1C 5.2 04/30/2015    Patient was never admitted.  Assessment & Plan:   Ronald Medina was seen today for knee pain.  Diagnoses and all orders for this visit:  Right knee pain -     DG Knee 1-2 Views Right; Future  Left hand pain -     DG Hand Complete Left;  Future  Hyperlipidemia LDL goal <130  Elevated blood pressure reading without diagnosis of hypertension   I have discontinued Ronald Medina frovatriptan, azithromycin, and benzonatate. I am also having him maintain his mometasone, atorvastatin, and sildenafil.  No orders of the defined types were placed in this encounter.   Patient is overweight. Weight loss goal is 15 to 20 pounds. Calorie counting handout printed for the patient. Also DASHeating plan due to elevated blood pressure printed for the patient. Follow-up: Return in about 6 months (around 12/03/2015).  Mechele Claude, M.D.

## 2015-06-05 NOTE — Patient Instructions (Signed)
Calorie Counting for Weight Loss Calories are energy you get from the things you eat and drink. Your body uses this energy to keep you going throughout the day. The number of calories you eat affects your weight. When you eat more calories than your body needs, your body stores the extra calories as fat. When you eat fewer calories than your body needs, your body burns fat to get the energy it needs. Calorie counting means keeping track of how many calories you eat and drink each day. If you make sure to eat fewer calories than your body needs, you should lose weight. In order for calorie counting to work, you will need to eat the number of calories that are right for you in a day to lose a healthy amount of weight per week. A healthy amount of weight to lose per week is usually 1-2 lb (0.5-0.9 kg). A dietitian can determine how many calories you need in a day and give you suggestions on how to reach your calorie goal.  WHAT IS MY MY PLAN? My goal is to have __1800________ calories per day.  If I have this many calories per day, I should lose around _____one_____ pounds per week. WHAT DO I NEED TO KNOW ABOUT CALORIE COUNTING? In order to meet your daily calorie goal, you will need to:  Find out how many calories are in each food you would like to eat. Try to do this before you eat.  Decide how much of the food you can eat.  Write down what you ate and how many calories it had. Doing this is called keeping a food log. WHERE DO I FIND CALORIE INFORMATION? The number of calories in a food can be found on a Nutrition Facts label. Note that all the information on a label is based on a specific serving of the food. If a food does not have a Nutrition Facts label, try to look up the calories online or ask your dietitian for help. HOW DO I DECIDE HOW MUCH TO EAT? To decide how much of the food you can eat, you will need to consider both the number of calories in one serving and the size of one serving. This  information can be found on the Nutrition Facts label. If a food does not have a Nutrition Facts label, look up the information online or ask your dietitian for help. Remember that calories are listed per serving. If you choose to have more than one serving of a food, you will have to multiply the calories per serving by the amount of servings you plan to eat. For example, the label on a package of bread might say that a serving size is 1 slice and that there are 90 calories in a serving. If you eat 1 slice, you will have eaten 90 calories. If you eat 2 slices, you will have eaten 180 calories. HOW DO I KEEP A FOOD LOG? After each meal, record the following information in your food log:  What you ate.  How much of it you ate.  How many calories it had.  Then, add up your calories. Keep your food log near you, such as in a small notebook in your pocket. Another option is to use a mobile app or website. Some programs will calculate calories for you and show you how many calories you have left each time you add an item to the log. WHAT ARE SOME CALORIE COUNTING TIPS?  Use your calories on foods  and drinks that will fill you up and not leave you hungry. Some examples of this include foods like nuts and nut butters, vegetables, lean proteins, and high-fiber foods (more than 5 g fiber per serving).  Eat nutritious foods and avoid empty calories. Empty calories are calories you get from foods or beverages that do not have many nutrients, such as candy and soda. It is better to have a nutritious high-calorie food (such as an avocado) than a food with few nutrients (such as a bag of chips).  Know how many calories are in the foods you eat most often. This way, you do not have to look up how many calories they have each time you eat them.  Look out for foods that may seem like low-calorie foods but are really high-calorie foods, such as baked goods, soda, and fat-free candy.  Pay attention to calories  in drinks. Drinks such as sodas, specialty coffee drinks, alcohol, and juices have a lot of calories yet do not fill you up. Choose low-calorie drinks like water and diet drinks.  Focus your calorie counting efforts on higher calorie items. Logging the calories in a garden salad that contains only vegetables is less important than calculating the calories in a milk shake.  Find a way of tracking calories that works for you. Get creative. Most people who are successful find ways to keep track of how much they eat in a day, even if they do not count every calorie. WHAT ARE SOME PORTION CONTROL TIPS?  Know how many calories are in a serving. This will help you know how many servings of a certain food you can have.  Use a measuring cup to measure serving sizes. This is helpful when you start out. With time, you will be able to estimate serving sizes for some foods.  Take some time to put servings of different foods on your favorite plates, bowls, and cups so you know what a serving looks like.  Try not to eat straight from a bag or box. Doing this can lead to overeating. Put the amount you would like to eat in a cup or on a plate to make sure you are eating the right portion.  Use smaller plates, glasses, and bowls to prevent overeating. This is a quick and easy way to practice portion control. If your plate is smaller, less food can fit on it.  Try not to multitask while eating, such as watching TV or using your computer. If it is time to eat, sit down at a table and enjoy your food. Doing this will help you to start recognizing when you are full. It will also make you more aware of what and how much you are eating. HOW CAN I CALORIE COUNT WHEN EATING OUT?  Ask for smaller portion sizes or child-sized portions.  Consider sharing an entree and sides instead of getting your own entree.  If you get your own entree, eat only half. Ask for a box at the beginning of your meal and put the rest of your  entree in it so you are not tempted to eat it.  Look for the calories on the menu. If calories are listed, choose the lower calorie options.  Choose dishes that include vegetables, fruits, whole grains, low-fat dairy products, and lean protein. Focusing on smart food choices from each of the 5 food groups can help you stay on track at restaurants.  Choose items that are boiled, broiled, grilled, or steamed.  Choose   water, milk, unsweetened iced tea, or other drinks without added sugars. If you want an alcoholic beverage, choose a lower calorie option. For example, a regular margarita can have up to 700 calories and a glass of wine has around 150.  Stay away from items that are buttered, battered, fried, or served with cream sauce. Items labeled "crispy" are usually fried, unless stated otherwise.  Ask for dressings, sauces, and syrups on the side. These are usually very high in calories, so do not eat much of them.  Watch out for salads. Many people think salads are a healthy option, but this is often not the case. Many salads come with bacon, fried chicken, lots of cheese, fried chips, and dressing. All of these items have a lot of calories. If you want a salad, choose a garden salad and ask for grilled meats or steak. Ask for the dressing on the side, or ask for olive oil and vinegar or lemon to use as dressing.  Estimate how many servings of a food you are given. For example, a serving of cooked rice is  cup or about the size of half a tennis ball or one cupcake wrapper. Knowing serving sizes will help you be aware of how much food you are eating at restaurants. The list below tells you how big or small some common portion sizes are based on everyday objects.  1 oz--4 stacked dice.  3 oz--1 deck of cards.  1 tsp--1 dice.  1 Tbsp-- a Ping-Pong ball.  2 Tbsp--1 Ping-Pong ball.   cup--1 tennis ball or 1 cupcake wrapper.  1 cup--1 baseball.   This information is not intended to  replace advice given to you by your health care provider. Make sure you discuss any questions you have with your health care provider.   Document Released: 06/30/2005 Document Revised: 07/21/2014 Document Reviewed: 05/05/2013 Elsevier Interactive Patient Education 2016 Elsevier Inc.   DASH Eating Plan DASH stands for "Dietary Approaches to Stop Hypertension." The DASH eating plan is a healthy eating plan that has been shown to reduce high blood pressure (hypertension). Additional health benefits may include reducing the risk of type 2 diabetes mellitus, heart disease, and stroke. The DASH eating plan may also help with weight loss. WHAT DO I NEED TO KNOW ABOUT THE DASH EATING PLAN? For the DASH eating plan, you will follow these general guidelines:  Choose foods with a percent daily value for sodium of less than 5% (as listed on the food label).  Use salt-free seasonings or herbs instead of table salt or sea salt.  Check with your health care provider or pharmacist before using salt substitutes.  Eat lower-sodium products, often labeled as "lower sodium" or "no salt added."  Eat fresh foods.  Eat more vegetables, fruits, and low-fat dairy products.  Choose whole grains. Look for the word "whole" as the first word in the ingredient list.  Choose fish and skinless chicken or [place] more often than red meat. Limit fish, poultry, and meat to 6 oz (170 g) each day.  Limit sweets, desserts, sugars, and sugary drinks.  Choose heart-healthy fats.  Limit cheese to 1 oz (28 g) per day.  Eat more home-cooked food and less restaurant, buffet, and fast food.  Limit fried foods.  Cook foods using methods other than frying.  Limit canned vegetables. If you do use them, rinse them well to decrease the sodium.  When eating at a restaurant, ask that your food be prepared with less salt, or no  salt if possible. WHAT FOODS CAN I EAT? Seek help from a dietitian for individual calorie  needs. Grains Whole grain or whole wheat bread. Brown rice. Whole grain or whole wheat pasta. Quinoa, bulgur, and whole grain cereals. Low-sodium cereals. Corn or whole wheat flour tortillas. Whole grain cornbread. Whole grain crackers. Low-sodium crackers. Vegetables Fresh or frozen vegetables (raw, steamed, roasted, or grilled). Low-sodium or reduced-sodium tomato and vegetable juices. Low-sodium or reduced-sodium tomato sauce and paste. Low-sodium or reduced-sodium canned vegetables.  Fruits All fresh, canned (in natural juice), or frozen fruits. Meat and Other Protein Products Ground beef (85% or leaner), grass-fed beef, or beef trimmed of fat. Skinless chicken or [place]. Ground chicken or [place]. Pork trimmed of fat. All fish and seafood. Eggs. Dried beans, peas, or lentils. Unsalted nuts and seeds. Unsalted canned beans. Dairy Low-fat dairy products, such as skim or 1% milk, 2% or reduced-fat cheeses, low-fat ricotta or cottage cheese, or plain low-fat yogurt. Low-sodium or reduced-sodium cheeses. Fats and Oils Tub margarines without trans fats. Light or reduced-fat mayonnaise and salad dressings (reduced sodium). Avocado. Safflower, olive, or canola oils. Natural peanut or almond butter. Other Unsalted popcorn and pretzels. The items listed above may not be a complete list of recommended foods or beverages. Contact your dietitian for more options. WHAT FOODS ARE NOT RECOMMENDED? Grains White bread. White pasta. White rice. Refined cornbread. Bagels and croissants. Crackers that contain trans fat. Vegetables Creamed or fried vegetables. Vegetables in a cheese sauce. Regular canned vegetables. Regular canned tomato sauce and paste. Regular tomato and vegetable juices. Fruits Dried fruits. Canned fruit in light or heavy syrup. Fruit juice. Meat and Other Protein Products Fatty cuts of meat. Ribs, chicken wings, bacon, sausage, bologna, salami, chitterlings, fatback, hot dogs, bratwurst,  and packaged luncheon meats. Salted nuts and seeds. Canned beans with salt. Dairy Whole or 2% milk, cream, half-and-half, and cream cheese. Whole-fat or sweetened yogurt. Full-fat cheeses or blue cheese. Nondairy creamers and whipped toppings. Processed cheese, cheese spreads, or cheese curds. Condiments Onion and garlic salt, seasoned salt, table salt, and sea salt. Canned and packaged gravies. Worcestershire sauce. Tartar sauce. Barbecue sauce. Teriyaki sauce. Soy sauce, including reduced sodium. Steak sauce. Fish sauce. Oyster sauce. Cocktail sauce. Horseradish. Ketchup and mustard. Meat flavorings and tenderizers. Bouillon cubes. Hot sauce. Tabasco sauce. Marinades. Taco seasonings. Relishes. Fats and Oils Butter, stick margarine, lard, shortening, ghee, and bacon fat. Coconut, palm kernel, or palm oils. Regular salad dressings. Other Pickles and olives. Salted popcorn and pretzels. The items listed above may not be a complete list of foods and beverages to avoid. Contact your dietitian for more information. WHERE CAN I FIND MORE INFORMATION? National Heart, Lung, and Blood Institute: [employer]   This information is not intended to replace advice given to you by your health care provider. Make sure you discuss any questions you have with your health care provider.   Document Released: 06/19/2011 Document Revised: 07/21/2014 Document Reviewed: 05/04/2013 Elsevier Interactive Patient Education [employer]

## 2015-10-10 ENCOUNTER — Encounter: Payer: Self-pay | Admitting: Family Medicine

## 2015-10-10 ENCOUNTER — Ambulatory Visit (INDEPENDENT_AMBULATORY_CARE_PROVIDER_SITE_OTHER): Payer: BLUE CROSS/BLUE SHIELD | Admitting: Family Medicine

## 2015-10-10 VITALS — BP 132/88 | HR 88 | Temp 97.3°F | Ht 67.0 in | Wt 187.0 lb

## 2015-10-10 DIAGNOSIS — Z0000 Encounter for general adult medical examination without abnormal findings: Secondary | ICD-10-CM

## 2015-10-10 DIAGNOSIS — E785 Hyperlipidemia, unspecified: Secondary | ICD-10-CM

## 2015-10-10 DIAGNOSIS — N529 Male erectile dysfunction, unspecified: Secondary | ICD-10-CM

## 2015-10-10 NOTE — Assessment & Plan Note (Signed)
Has used Viagra a few times and has done okay for him. We'll continue using intermittently.

## 2015-10-10 NOTE — Assessment & Plan Note (Signed)
No issues with medication, will have labs done through work in June and send me the results.

## 2015-10-10 NOTE — Progress Notes (Signed)
BP 132/88 mmHg  Pulse 88  Temp(Src) 97.3 F (36.3 C) (Oral)  Ht  (1.702 m)  Wt 187 lb (84.823 kg)  BMI 29.28 kg/m2   Subjective:    Patient ID: Ronald Medina, male    DOB: 1975/09/10, 40 y.o.   MRN: [id number]  HPI: Ronald Medina is a 40 y.o. male presenting on 10/10/2015 for Annual Exam   HPI Well adult exam Patient is coming in today to get his annual physical and denies any issues. He denies any chest pain, shortness of breath, headaches or vision issues, abdominal complaints, diarrhea, nausea, vomiting, or joint issues. His cholesterol medication and is doing well for him and the Viagra which he uses half a tablet intermittently has been doing well for him as well.  Relevant past medical, surgical, family and social history reviewed and updated as indicated. Interim medical history since our last visit reviewed. Allergies and medications reviewed and updated.  Review of Systems  Constitutional: Negative for fever and chills.  HENT: Negative for ear discharge and ear pain.   Eyes: Negative for discharge and visual disturbance.  Respiratory: Negative for shortness of breath and wheezing.   Cardiovascular: Negative for chest pain and leg swelling.  Gastrointestinal: Negative for abdominal pain, diarrhea and constipation.  Genitourinary: Negative for difficulty urinating.  Musculoskeletal: Negative for back pain and gait problem.  Skin: Negative for rash.  Neurological: Negative for dizziness, syncope, light-headedness and headaches.  All other systems reviewed and are negative.   Per HPI unless specifically indicated above     Medication List       This list is accurate as of: 10/10/15 12:06 PM.  Always use your most recent med list.               atorvastatin 20 MG tablet  Commonly known as:  LIPITOR  Take 1 tablet (20 mg total) by mouth daily.     mometasone 50 MCG/ACT nasal spray  Commonly known as:  NASONEX  Place 1 spray into the nose 2 (two) times  daily as needed.     sildenafil 100 MG tablet  Commonly known as:  VIAGRA  Take 0.5-1 tablets (50-100 mg total) by mouth daily as needed for erectile dysfunction.           Objective:    BP 132/88 mmHg  Pulse 88  Temp(Src) 97.3 F (36.3 C) (Oral)  Ht  (1.702 m)  Wt 187 lb (84.823 kg)  BMI 29.28 kg/m2  Wt Readings from Last 3 Encounters:  10/10/15 187 lb (84.823 kg)  06/05/15 181 lb 9.6 oz (82.373 kg)  05/23/15 189 lb (85.73 kg)    Physical Exam  Constitutional: He is oriented to person, place, and time. He appears well-developed and well-nourished. No distress.  HENT:  Right Ear: External ear normal.  Left Ear: External ear normal.  Nose: Nose normal.  Mouth/Throat: Oropharynx is clear and moist. No oropharyngeal exudate.  Eyes: Conjunctivae and EOM are normal. Pupils are equal, round, and reactive to light. Right eye exhibits no discharge. No scleral icterus.  Neck: Neck supple. No thyromegaly present.  Cardiovascular: Normal rate, regular rhythm, normal heart sounds and intact distal pulses.   No murmur heard. Pulmonary/Chest: Effort normal and breath sounds normal. No respiratory distress. He has no wheezes.  Abdominal: Soft. Bowel sounds are normal. He exhibits no distension. There is no tenderness. There is no rebound and no guarding.  Genitourinary:  Patient declined  Musculoskeletal: Normal range  of motion. He exhibits no edema or tenderness.  Lymphadenopathy:    He has no cervical adenopathy.  Neurological: He is alert and oriented to person, place, and time. Coordination normal.  Skin: Skin is warm and dry. No rash noted. He is not diaphoretic.  Psychiatric: He has a normal mood and affect. His behavior is normal. Judgment and thought content normal.  Vitals reviewed.   Results for orders placed or performed in visit on 04/30/15  POCT glycosylated hemoglobin (Hb A1C)  Result Value Ref Range   Hemoglobin A1C 5.2       Assessment & Plan:   Problem  List Items Addressed This Visit      Genitourinary   Erectile dysfunction    Has used Viagra a few times and has done okay for him. We'll continue using intermittently.        Other   Hyperlipidemia LDL goal <130    No issues with medication, will have labs done through work in June and send me the results.       Other Visit Diagnoses    Well adult exam    -  Primary        Follow up plan: Return in about 1 year (around 10/09/2016), or if symptoms worsen or fail to improve.  Counseling provided for all of the vaccine components No orders of the defined types were placed in this encounter.    Arville CareJoshua Nuriyah Hanline, MD [facility] 10/10/2015, 12:06 PM

## 2015-10-29 ENCOUNTER — Ambulatory Visit: Payer: BLUE CROSS/BLUE SHIELD | Admitting: Family Medicine

## 2015-10-30 ENCOUNTER — Other Ambulatory Visit: Payer: Self-pay | Admitting: Family Medicine

## 2015-10-31 ENCOUNTER — Encounter: Payer: Self-pay | Admitting: Family Medicine

## 2015-10-31 NOTE — Telephone Encounter (Signed)
Last seen 10/10/15 Dr Dettinger   Dr Darlyn ReadStacks  PCP

## 2016-04-16 ENCOUNTER — Other Ambulatory Visit: Payer: Self-pay | Admitting: Family Medicine

## 2016-04-16 DIAGNOSIS — N529 Male erectile dysfunction, unspecified: Secondary | ICD-10-CM

## 2016-04-17 NOTE — Telephone Encounter (Signed)
Authorize 30 days only. Then contact the patient letting them know that they will need an appointment before any further prescriptions can be sent in. 

## 2016-04-28 ENCOUNTER — Telehealth: Payer: Self-pay | Admitting: Family Medicine

## 2016-04-28 DIAGNOSIS — N529 Male erectile dysfunction, unspecified: Secondary | ICD-10-CM

## 2016-04-28 NOTE — Telephone Encounter (Signed)
Okay at current level for 6 mos. Thanks ws 

## 2016-04-29 MED ORDER — SILDENAFIL CITRATE 100 MG PO TABS: ORAL_TABLET | ORAL | 2 refills | Status: DC

## 2016-04-29 NOTE — Telephone Encounter (Signed)
Medication has been sent to pharmacy.  °

## 2016-04-29 NOTE — Addendum Note (Signed)
Addended by: Caryl BisBOWMAN, Shelia Kingsberry M on: 04/29/2016 08:47 AM   Modules accepted: Orders

## 2016-06-25 DIAGNOSIS — L573 Poikiloderma of Civatte: Secondary | ICD-10-CM | POA: Diagnosis not present

## 2016-06-25 DIAGNOSIS — L718 Other rosacea: Secondary | ICD-10-CM | POA: Diagnosis not present

## 2016-07-22 ENCOUNTER — Other Ambulatory Visit: Payer: Self-pay | Admitting: Family Medicine

## 2016-07-22 DIAGNOSIS — N529 Male erectile dysfunction, unspecified: Secondary | ICD-10-CM

## 2016-07-29 ENCOUNTER — Other Ambulatory Visit: Payer: Self-pay | Admitting: Family Medicine

## 2016-07-29 DIAGNOSIS — N529 Male erectile dysfunction, unspecified: Secondary | ICD-10-CM

## 2016-08-20 ENCOUNTER — Ambulatory Visit (INDEPENDENT_AMBULATORY_CARE_PROVIDER_SITE_OTHER): Payer: BLUE CROSS/BLUE SHIELD | Admitting: Family Medicine

## 2016-08-20 ENCOUNTER — Encounter: Payer: Self-pay | Admitting: Family Medicine

## 2016-08-20 VITALS — BP 147/79 | HR 110 | Temp 100.2°F | Ht 67.0 in | Wt 213.0 lb

## 2016-08-20 DIAGNOSIS — J101 Influenza due to other identified influenza virus with other respiratory manifestations: Secondary | ICD-10-CM | POA: Diagnosis not present

## 2016-08-20 LAB — VERITOR FLU A/B WAIVED
Influenza A: POSITIVE — AB
Influenza B: NEGATIVE

## 2016-08-20 MED ORDER — OSELTAMIVIR PHOSPHATE 75 MG PO CAPS: 75.0000 mg | ORAL_CAPSULE | Freq: Two times a day (BID) | ORAL | 0 refills | Status: DC

## 2016-08-20 NOTE — Progress Notes (Signed)
BP (!) 147/79   Pulse (!) 110   Temp 100.2 F (37.9 C) (Oral)   Ht 5\' 7"  (1.702 m)   Wt 213 lb (96.6 kg)   BMI 33.36 kg/m    Subjective:    Patient ID: Ronald Medina, male    DOB: [birth year], 41 y.o.   MRN: [id number]  HPI: Ronald Medina is a 41 y.o. male presenting on 08/20/2016 for Headache; Cough; and Generalized Body Aches   HPI Cough congestion and body aches Patient is been having cough and congestion and body aches that's been going on for the past 2 days. He has been using ibuprofen which has helped with the achiness but has not used anything else up to this point. His fever was low-grade at 100.2 here in the office but he did not know that he had the fever. He has felt some chills. He has had some cough and some chest congestion and a headache associated with his cough and chest congestion. This all seemed to ramp up yesterday. He denies any sick contacts that he knows of.  Relevant past medical, surgical, family and social history reviewed and updated as indicated. Interim medical history since our last visit reviewed. Allergies and medications reviewed and updated.  Review of Systems  Constitutional: Positive for fever. Negative for chills.  HENT: Positive for congestion, postnasal drip, rhinorrhea, sinus pressure, sneezing and sore throat. Negative for ear discharge, ear pain and voice change.   Eyes: Negative for pain, discharge, redness and visual disturbance.  Respiratory: Positive for cough. Negative for shortness of breath and wheezing.   Cardiovascular: Negative for chest pain and leg swelling.  Musculoskeletal: Negative for gait problem.  Skin: Negative for rash.  All other systems reviewed and are negative.   Per HPI unless specifically indicated above     Objective:    BP (!) 147/79   Pulse (!) 110   Temp 100.2 F (37.9 C) (Oral)   Ht 5\' 7"  (1.702 m)   Wt 213 lb (96.6 kg)   BMI 33.36 kg/m   Wt Readings from Last 3 Encounters:  08/20/16 213 lb (96.6  kg)  10/10/15 187 lb (84.8 kg)  06/05/15 181 lb 9.6 oz (82.4 kg)    Physical Exam  Constitutional: He is oriented to person, place, and time. He appears well-developed and well-nourished. No distress.  HENT:  Right Ear: Tympanic membrane, external ear and ear canal normal.  Left Ear: Tympanic membrane, external ear and ear canal normal.  Nose: Mucosal edema and rhinorrhea present. No sinus tenderness. No epistaxis. Right sinus exhibits maxillary sinus tenderness. Right sinus exhibits no frontal sinus tenderness. Left sinus exhibits maxillary sinus tenderness. Left sinus exhibits no frontal sinus tenderness.  Mouth/Throat: Uvula is midline and mucous membranes are normal. Posterior oropharyngeal edema and posterior oropharyngeal erythema present. No oropharyngeal exudate or tonsillar abscesses.  Eyes: Conjunctivae are normal. Right eye exhibits no discharge. Left eye exhibits no discharge. No scleral icterus.  Neck: Neck supple. No thyromegaly present.  Cardiovascular: Normal rate, regular rhythm, normal heart sounds and intact distal pulses.   No murmur heard. Pulmonary/Chest: Effort normal and breath sounds normal. No respiratory distress. He has no wheezes. He has no rales.  Musculoskeletal: Normal range of motion. He exhibits no edema.  Lymphadenopathy:    He has no cervical adenopathy.  Neurological: He is alert and oriented to person, place, and time. Coordination normal.  Skin: Skin is warm and dry. No rash noted. He is not diaphoretic.  Psychiatric: He has a normal mood and affect. His behavior is normal.  Nursing note and vitals reviewed.  Influenza A positive    Assessment & Plan:   Problem List Items Addressed This Visit    None    Visit Diagnoses    Influenza A    -  Primary   Relevant Medications   oseltamivir (TAMIFLU) 75 MG capsule   Other Relevant Orders   Veritor Flu A/B Waived      Follow up plan: Return if symptoms worsen or fail to improve.  Counseling  provided for all of the vaccine components Orders Placed This Encounter  Procedures  . Veritor Flu A/B Waived    Arville CareJoshua Anadia Helmes, MD [facility] 08/20/2016, 3:45 PM

## 2016-09-25 ENCOUNTER — Other Ambulatory Visit: Payer: Self-pay | Admitting: Family Medicine

## 2016-09-25 DIAGNOSIS — N529 Male erectile dysfunction, unspecified: Secondary | ICD-10-CM

## 2016-11-20 ENCOUNTER — Ambulatory Visit (INDEPENDENT_AMBULATORY_CARE_PROVIDER_SITE_OTHER): Payer: BLUE CROSS/BLUE SHIELD | Admitting: Family

## 2016-11-20 ENCOUNTER — Encounter: Payer: Self-pay | Admitting: Family

## 2016-11-20 VITALS — BP 112/64 | HR 81 | Temp 97.9°F | Ht 67.0 in | Wt 213.4 lb

## 2016-11-20 DIAGNOSIS — E669 Obesity, unspecified: Secondary | ICD-10-CM

## 2016-11-20 DIAGNOSIS — E785 Hyperlipidemia, unspecified: Secondary | ICD-10-CM

## 2016-11-20 DIAGNOSIS — Z0000 Encounter for general adult medical examination without abnormal findings: Secondary | ICD-10-CM

## 2016-11-20 NOTE — Patient Instructions (Signed)
 Health Maintenance, Male A healthy lifestyle and preventive care is important for your health and wellness. Ask your health care provider about what schedule of regular examinations is right for you. What should I know about weight and diet?  Eat a Healthy Diet  Eat plenty of vegetables, fruits, whole grains, low-fat dairy products, and lean protein.  Do not eat a lot of foods high in solid fats, added sugars, or salt. Maintain a Healthy Weight  Regular exercise can help you achieve or maintain a healthy weight. You should:  Do at least 150 minutes of exercise each week. The exercise should increase your heart rate and make you sweat (moderate-intensity exercise).  Do strength-training exercises at least twice a week. Watch Your Levels of Cholesterol and Blood Lipids  Have your blood tested for lipids and cholesterol every 5 years starting at 42 years of age. If you are at high risk for heart disease, you should start having your blood tested when you are 42 years old. You may need to have your cholesterol levels checked more often if:  Your lipid or cholesterol levels are high.  You are older than 42 years of age.  You are at high risk for heart disease. What should I know about cancer screening? Many types of cancers can be detected early and may often be prevented. Lung Cancer  You should be screened every year for lung cancer if:  You are a current smoker who has smoked for at least 30 years.  You are a former smoker who has quit within the past 15 years.  Talk to your health care provider about your screening options, when you should start screening, and how often you should be screened. Colorectal Cancer  Routine colorectal cancer screening usually begins at 42 years of age and should be repeated every 5-10 years until you are 42 years old. You may need to be screened more often if early forms of precancerous polyps or small growths are found. Your health care provider  may recommend screening at an earlier age if you have risk factors for colon cancer.  Your health care provider may recommend using home test kits to check for hidden blood in the stool.  A small camera at the end of a tube can be used to examine your colon (sigmoidoscopy or colonoscopy). This checks for the earliest forms of colorectal cancer. Prostate and Testicular Cancer  Depending on your age and overall health, your health care provider may do certain tests to screen for prostate and testicular cancer.  Talk to your health care provider about any symptoms or concerns you have about testicular or prostate cancer. Skin Cancer  Check your skin from head to toe regularly.  Tell your health care provider about any new moles or changes in moles, especially if:  There is a change in a mole's size, shape, or color.  You have a mole that is larger than a pencil eraser.  Always use sunscreen. Apply sunscreen liberally and repeat throughout the day.  Protect yourself by wearing long sleeves, pants, a wide-brimmed hat, and sunglasses when outside. What should I know about heart disease, diabetes, and high blood pressure?  If you are 18-39 years of age, have your blood pressure checked every 3-5 years. If you are 40 years of age or older, have your blood pressure checked every year. You should have your blood pressure measured twice-once when you are at a hospital or clinic, and once when you are not at   a hospital or clinic. Record the average of the two measurements. To check your blood pressure when you are not at a hospital or clinic, you can use:  An automated blood pressure machine at a pharmacy.  A home blood pressure monitor.  Talk to your health care provider about your target blood pressure.  If you are between 45-79 years old, ask your health care provider if you should take aspirin to prevent heart disease.  Have regular diabetes screenings by checking your fasting blood sugar  level.  If you are at a normal weight and have a low risk for diabetes, have this test once every three years after the age of 45.  If you are overweight and have a high risk for diabetes, consider being tested at a younger age or more often.  A one-time screening for abdominal aortic aneurysm (AAA) by ultrasound is recommended for men aged 65-75 years who are current or former smokers. What should I know about preventing infection? Hepatitis B  If you have a higher risk for hepatitis B, you should be screened for this virus. Talk with your health care provider to find out if you are at risk for hepatitis B infection. Hepatitis C  Blood testing is recommended for:  Everyone born from 1945 through 1965.  Anyone with known risk factors for hepatitis C. Sexually Transmitted Diseases (STDs)  You should be screened each year for STDs including gonorrhea and chlamydia if:  You are sexually active and are younger than 42 years of age.  You are older than 42 years of age and your health care provider tells you that you are at risk for this type of infection.  Your sexual activity has changed since you were last screened and you are at an increased risk for chlamydia or gonorrhea. Ask your health care provider if you are at risk.  Talk with your health care provider about whether you are at high risk of being infected with HIV. Your health care provider may recommend a prescription medicine to help prevent HIV infection. What else can I do?  Schedule regular health, dental, and eye exams.  Stay current with your vaccines (immunizations).  Do not use any tobacco products, such as cigarettes, chewing tobacco, and e-cigarettes. If you need help quitting, ask your health care provider.  Limit alcohol intake to no more than 2 drinks per day. One drink equals 12 ounces of beer, 5 ounces of wine, or 1 ounces of hard liquor.  Do not use street drugs.  Do not share needles.  Ask your health  care provider for help if you need support or information about quitting drugs.  Tell your health care provider if you often feel depressed.  Tell your health care provider if you have ever been abused or do not feel safe at home. This information is not intended to replace advice given to you by your health care provider. Make sure you discuss any questions you have with your health care provider. Document Released: 12/27/2007 Document Revised: 02/27/2016 Document Reviewed: 04/03/2015 Elsevier Interactive Patient Education  2017 [employer] Inc[employer]  

## 2016-11-20 NOTE — Progress Notes (Signed)
   Subjective:    Patient ID: Ronald Medina, male    DOB: 1975-10-22, 41 y.o.   MRN: [id number]  Pt presents to the office today for CPE. PT currently not taking any medications at this time.  Hyperlipidemia  This is a chronic problem. The current episode started more than 1 year ago. The problem is uncontrolled. Recent lipid tests were reviewed and are high. Exacerbating diseases include obesity. Current antihyperlipidemic treatment includes diet change. The current treatment provides mild improvement of lipids. Risk factors for coronary artery disease include dyslipidemia, post-menopausal, a sedentary lifestyle and family history.      Review of Systems  All other systems reviewed and are negative.      Objective:   Physical Exam  Constitutional: He is oriented to person, place, and time. He appears well-developed and well-nourished. No distress.  HENT:  Head: Normocephalic.  Right Ear: External ear normal.  Left Ear: External ear normal.  Nose: Nose normal.  Mouth/Throat: Oropharynx is clear and moist.  Eyes: Pupils are equal, round, and reactive to light. Right eye exhibits no discharge. Left eye exhibits no discharge.  Neck: Normal range of motion. Neck supple. No thyromegaly present.  Cardiovascular: Normal rate, regular rhythm, normal heart sounds and intact distal pulses.   No murmur heard. Pulmonary/Chest: Effort normal and breath sounds normal. No respiratory distress. He has no wheezes.  Abdominal: Soft. Bowel sounds are normal. He exhibits no distension. There is no tenderness.  Musculoskeletal: Normal range of motion. He exhibits no edema or tenderness.  Neurological: He is alert and oriented to person, place, and time. He has normal reflexes. No cranial nerve deficit.  Skin: Skin is warm and dry. No rash noted. No erythema.  Psychiatric: He has a normal mood and affect. His behavior is normal. Judgment and thought content normal.  Vitals reviewed.     BP 112/64    Pulse 81   Temp 97.9 F (36.6 C) (Oral)   Ht '5\' 7"'$  (1.702 m)   Wt 213 lb 6.4 oz (96.8 kg)   BMI 33.42 kg/m      Assessment & Plan:  1. Annual physical exam - CMP14+EGFR - Lipid panel - Thyroid Panel With TSH - VITAMIN D 25 Hydroxy (Vit-D Deficiency, Fractures) - PSA, total and free - CBC with Differential/Platelet  2. Hyperlipidemia, unspecified hyperlipidemia type - CMP14+EGFR  3. Obesity (BMI 30-39.9)   Continue all meds Labs pending Health Maintenance reviewed Diet and exercise encouraged RTO 1 year   Evelina Dun, FNP

## 2016-12-31 ENCOUNTER — Ambulatory Visit (INDEPENDENT_AMBULATORY_CARE_PROVIDER_SITE_OTHER): Payer: BLUE CROSS/BLUE SHIELD | Admitting: Family Medicine

## 2016-12-31 ENCOUNTER — Encounter: Payer: Self-pay | Admitting: Family Medicine

## 2016-12-31 VITALS — BP 126/79 | Temp 98.0°F | Ht 67.0 in | Wt 212.0 lb

## 2016-12-31 DIAGNOSIS — H60332 Swimmer's ear, left ear: Secondary | ICD-10-CM | POA: Diagnosis not present

## 2016-12-31 MED ORDER — NEOMYCIN-POLYMYXIN-HC 3.5-10000-1 OT SOLN: 3.0000 [drp] | Freq: Four times a day (QID) | OTIC | 0 refills | Status: DC

## 2016-12-31 NOTE — Progress Notes (Signed)
BP 126/79 (BP Location: Right Arm, Patient Position: Sitting, Cuff Size: Normal)   Temp 98 F (36.7 C) (Oral)   Ht 5\' 7"  (1.702 m)   Wt 212 lb (96.2 kg)   BMI 33.20 kg/m    Subjective:    Patient ID: Ronald Medina, male    DOB: 09/12/1975, 41 y.o.   MRN: [id number]  HPI: Ronald Medina is a 41 y.o. male presenting on 12/31/2016 for Otalgia (Left ear pain. Noticed it this morning when he went to put in his ear plugs. )   HPI Left ear pain Patient noticed that he had significant left ear pain this morning when he was at work trying to put his earplugs in. He had a looked at by the nurse at work and saw the redness and swelling and there and she wanted him to come in. He denies any sinus congestion or pressure or ear drainage or pain anywhere else, he just noticed in that left ear canal with the earplugs that he was put in. Right now the pain is minimal but it mainly hurts because he has to wear earplugs all day.  Relevant past medical, surgical, family and social history reviewed and updated as indicated. Interim medical history since our last visit reviewed. Allergies and medications reviewed and updated.  Review of Systems  Constitutional: Negative for chills and fever.  HENT: Positive for ear pain. Negative for congestion, ear discharge, rhinorrhea, sinus pain, sinus pressure, sneezing and sore throat.   Respiratory: Negative for shortness of breath and wheezing.   Cardiovascular: Negative for chest pain and leg swelling.  All other systems reviewed and are negative.   Per HPI unless specifically indicated above        Objective:    BP 126/79 (BP Location: Right Arm, Patient Position: Sitting, Cuff Size: Normal)   Temp 98 F (36.7 C) (Oral)   Ht 5\' 7"  (1.702 m)   Wt 212 lb (96.2 kg)   BMI 33.20 kg/m   Wt Readings from Last 3 Encounters:  12/31/16 212 lb (96.2 kg)  11/20/16 213 lb 6.4 oz (96.8 kg)  08/20/16 213 lb (96.6 kg)    Physical Exam  Constitutional: He is  oriented to person, place, and time. He appears well-developed and well-nourished. No distress.  HENT:  Right Ear: Hearing, tympanic membrane and ear canal normal.  Left Ear: Hearing normal. There is swelling and tenderness. No drainage (Patient does not have any drainage but does have significant cerumen in his left ear canal). No mastoid tenderness.  Eyes: Conjunctivae are normal. No scleral icterus.  Musculoskeletal: Normal range of motion. He exhibits no edema.  Neurological: He is alert and oriented to person, place, and time. Coordination normal.  Skin: Skin is warm and dry. No rash noted. He is not diaphoretic.  Psychiatric: He has a normal mood and affect. His behavior is normal.  Nursing note and vitals reviewed.       Assessment & Plan:   Problem List Items Addressed This Visit    None    Visit Diagnoses    Acute swimmer's ear of left side    -  Primary   Relevant Medications   neomycin-polymyxin-hydrocortisone (CORTISPORIN) OTIC solution       Follow up plan: Return if symptoms worsen or fail to improve.  Counseling provided for all of the vaccine components No orders of the defined types were placed in this encounter.   Arville CareJoshua Advith Martine, MD [facility] 12/31/2016, 5:48  PM

## 2017-01-29 ENCOUNTER — Other Ambulatory Visit: Payer: Self-pay | Admitting: Family Medicine

## 2017-01-29 DIAGNOSIS — N529 Male erectile dysfunction, unspecified: Secondary | ICD-10-CM

## 2017-01-29 NOTE — Telephone Encounter (Signed)
Last seen 12/31/16 Dr Dettinger  Dr Darlyn ReadStacks PCP  If approved route to nurse to call into [facility]

## 2017-02-03 MED ORDER — SILDENAFIL CITRATE 100 MG PO TABS: ORAL_TABLET | ORAL | 2 refills | Status: DC

## 2017-02-03 NOTE — Addendum Note (Signed)
Addended by: Margurite AuerbachOMPTON, Ally Knodel G on: 02/03/2017 02:45 PM   Modules accepted: Orders

## 2017-05-15 ENCOUNTER — Other Ambulatory Visit: Payer: Self-pay | Admitting: Family Medicine

## 2017-05-15 DIAGNOSIS — N529 Male erectile dysfunction, unspecified: Secondary | ICD-10-CM

## 2017-05-15 MED ORDER — SILDENAFIL CITRATE 100 MG PO TABS: ORAL_TABLET | ORAL | 2 refills | Status: DC

## 2017-05-15 NOTE — Telephone Encounter (Signed)
Rx sent to pharmacy   

## 2017-09-18 ENCOUNTER — Other Ambulatory Visit: Payer: Self-pay | Admitting: Family Medicine

## 2017-09-18 DIAGNOSIS — N529 Male erectile dysfunction, unspecified: Secondary | ICD-10-CM

## 2017-09-21 NOTE — Telephone Encounter (Signed)
Last seen 12/31/16  Dr Stacks 

## 2018-01-08 ENCOUNTER — Telehealth: Payer: Self-pay | Admitting: Family Medicine

## 2018-01-08 ENCOUNTER — Other Ambulatory Visit: Payer: Self-pay | Admitting: Family Medicine

## 2018-01-08 DIAGNOSIS — N529 Male erectile dysfunction, unspecified: Secondary | ICD-10-CM

## 2018-01-08 MED ORDER — SILDENAFIL CITRATE 100 MG PO TABS: ORAL_TABLET | ORAL | 5 refills | Status: DC

## 2018-01-08 NOTE — Telephone Encounter (Signed)
I sent in the requested prescription 

## 2018-01-18 ENCOUNTER — Ambulatory Visit: Payer: BLUE CROSS/BLUE SHIELD | Admitting: Family Medicine

## 2018-01-18 ENCOUNTER — Encounter: Payer: Self-pay | Admitting: Family Medicine

## 2018-01-18 ENCOUNTER — Ambulatory Visit (INDEPENDENT_AMBULATORY_CARE_PROVIDER_SITE_OTHER): Payer: BLUE CROSS/BLUE SHIELD

## 2018-01-18 VITALS — BP 134/89 | HR 108 | Temp 97.5°F | Ht 67.0 in | Wt 224.0 lb

## 2018-01-18 DIAGNOSIS — M25561 Pain in right knee: Secondary | ICD-10-CM

## 2018-01-18 DIAGNOSIS — G8929 Other chronic pain: Secondary | ICD-10-CM

## 2018-01-18 DIAGNOSIS — M1711 Unilateral primary osteoarthritis, right knee: Secondary | ICD-10-CM | POA: Diagnosis not present

## 2018-01-18 MED ORDER — METHYLPREDNISOLONE ACETATE 80 MG/ML IJ SUSP
80.0000 mg | Freq: Once | INTRAMUSCULAR | Status: AC
  Administered 2018-01-18: 80 mg via INTRAMUSCULAR

## 2018-01-18 NOTE — Progress Notes (Signed)
BP 134/89   Pulse (!) 108   Temp (!) 97.5 F (36.4 C) (Oral)   Ht 5\' 7"  (1.702 m)   Wt 224 lb (101.6 kg)   BMI 35.08 kg/m    Subjective:    Patient ID: Ronald Medina, male    DOB: 02-27-1976, 42 y.o.   MRN: [id number]  HPI: Ronald Medina is a 42 y.o. male presenting on 01/18/2018 for Knee Pain (right knee, ongoing for several years, seems to be worsening)   HPI Right medial knee pain Patient comes in complaining of right medial knee pain that has been bothering him more over the past week but he has had this issue for several years.  He has had a couple injections and an x-ray previously but it has been a few years since he has had any of those.  He denies any fevers or chills or redness or swelling but he says it mostly hurts on the middle aspect of his knee and says the pain is mild to moderate but just more nagging and is coming in to see if he can get something for like he has had previously.  Relevant past medical, surgical, family and social history reviewed and updated as indicated. Interim medical history since our last visit reviewed. Allergies and medications reviewed and updated.  Review of Systems  Constitutional: Negative for chills and fever.  Respiratory: Negative for shortness of breath and wheezing.   Cardiovascular: Negative for chest pain and leg swelling.  Musculoskeletal: Positive for arthralgias. Negative for back pain, gait problem and joint swelling.  Skin: Negative for color change and rash.  All other systems reviewed and are negative.   Per HPI unless specifically indicated above   Allergies as of 01/18/2018   No Known Allergies     Medication List        Accurate as of 01/18/18  4:31 PM. Always use your most recent med list.          atorvastatin 20 MG tablet Commonly known as:  LIPITOR Take 1 tablet (20 mg total) by mouth daily.   frovatriptan 2.5 MG tablet Commonly known as:  FROVA TAKE 1 TAB BY MOUTH AS NEEDED FOR MIGRAINE. IF RECURS, MAY  REPEAT IN 2HOURS. MAX OF 3TABS IN 24HOURS   neomycin-polymyxin-hydrocortisone OTIC solution Commonly known as:  CORTISPORIN Place 3 drops into the left ear 4 (four) times daily. Give for 7 days   sildenafil 100 MG tablet Commonly known as:  VIAGRA TAKE 1/2 TO 1 TABLET DAILY AS NEEDED FOR ERECTILE DYSFUNCTION          Objective:    BP 134/89   Pulse (!) 108   Temp (!) 97.5 F (36.4 C) (Oral)   Ht 5\' 7"  (1.702 m)   Wt 224 lb (101.6 kg)   BMI 35.08 kg/m   Wt Readings from Last 3 Encounters:  01/18/18 224 lb (101.6 kg)  12/31/16 212 lb (96.2 kg)  11/20/16 213 lb 6.4 oz (96.8 kg)    Physical Exam  Constitutional: He is oriented to person, place, and time. He appears well-developed and well-nourished. No distress.  Eyes: Conjunctivae are normal. No scleral icterus.  Neck: Neck supple. No thyromegaly present.  Cardiovascular: Normal rate, regular rhythm, normal heart sounds and intact distal pulses.  No murmur heard. Pulmonary/Chest: Effort normal and breath sounds normal. No respiratory distress. He has no wheezes.  Musculoskeletal: Normal range of motion. He exhibits tenderness. He exhibits no edema.  Right knee: He exhibits normal alignment, no LCL laxity, normal patellar mobility, no bony tenderness, normal meniscus and no MCL laxity. Tenderness found. Medial joint line tenderness noted.  Lymphadenopathy:    He has no cervical adenopathy.  Neurological: He is alert and oriented to person, place, and time. Coordination normal.  Skin: Skin is warm and dry. No rash noted. He is not diaphoretic.  Psychiatric: He has a normal mood and affect. His behavior is normal.  Nursing note and vitals reviewed.  Right knee x-ray: Mild right medial compartment narrowing, no other acute bony abnormality, await final read from radiology  Knee injection: Verbal consent obtained. Risk factors of bleeding and infection discussed with patient and patient is agreeable towards injection.  Patient prepped with Betadine. Lateral approach towards injection used. Injected 80 mg of Depo-Medrol and 1 mL of 2% lidocaine. Patient tolerated procedure well and no side effects from noted. Minimal to no bleeding. Simple bandage applied after.     Assessment & Plan:   Problem List Items Addressed This Visit    None    Visit Diagnoses    Chronic pain of right knee    -  Primary   Worsened over the past week since he came back from vacation   Relevant Medications   methylPREDNISolone acetate (DEPO-MEDROL) injection 80 mg   Other Relevant Orders   DG Knee 1-2 Views Right       Follow up plan: Return if symptoms worsen or fail to improve.  Counseling provided for all of the vaccine components Orders Placed This Encounter  Procedures  . DG Knee 1-2 Views Right    Arville CareJoshua Dettinger, MD [facility] 01/18/2018, 4:31 PM

## 2018-02-25 ENCOUNTER — Ambulatory Visit: Payer: BLUE CROSS/BLUE SHIELD | Admitting: Family

## 2018-02-25 ENCOUNTER — Telehealth: Payer: Self-pay | Admitting: Family Medicine

## 2018-02-25 ENCOUNTER — Encounter: Payer: Self-pay | Admitting: Family

## 2018-02-25 VITALS — BP 136/93 | HR 89 | Temp 97.4°F | Ht 67.0 in | Wt 223.8 lb

## 2018-02-25 DIAGNOSIS — M109 Gout, unspecified: Secondary | ICD-10-CM

## 2018-02-25 MED ORDER — DICLOFENAC SODIUM 75 MG PO TBEC: 75.0000 mg | DELAYED_RELEASE_TABLET | Freq: Two times a day (BID) | ORAL | 0 refills | Status: DC

## 2018-02-25 MED ORDER — PREDNISONE 10 MG (21) PO TBPK: ORAL_TABLET | ORAL | 0 refills | Status: DC

## 2018-02-25 MED ORDER — COLCHICINE 0.6 MG PO TABS: ORAL_TABLET | ORAL | 2 refills | Status: DC

## 2018-02-25 NOTE — Telephone Encounter (Signed)
I sent in Diclofenac and prednisone. Do not take any other NSAID's while taking diclofenac.

## 2018-02-25 NOTE — Patient Instructions (Signed)

## 2018-02-25 NOTE — Telephone Encounter (Signed)
Please review and advise.

## 2018-02-25 NOTE — Progress Notes (Signed)
   Subjective:    Patient ID: Ronald Medina, male    DOB: 01-25-1976, 42 y.o.   MRN: [id number]  Chief Complaint  Patient presents with  . right foot pain    Foot Injury   The incident occurred 12 to 24 hours ago. There was no injury mechanism. The pain is at a severity of 8/10. The pain is moderate. The pain has been constant since onset. Associated symptoms include an inability to bear weight. Pertinent negatives include no numbness or tingling. He reports no foreign bodies present. The symptoms are aggravated by movement and weight bearing. He has tried rest and NSAIDs for the symptoms. The treatment provided no relief.      Review of Systems  Neurological: Negative for tingling and numbness.  All other systems reviewed and are negative.      Objective:   Physical Exam  Constitutional: He is oriented to person, place, and time. He appears well-developed and well-nourished. No distress.  HENT:  Head: Normocephalic.  Eyes: Pupils are equal, round, and reactive to light. Right eye exhibits no discharge. Left eye exhibits no discharge.  Neck: Normal range of motion. Neck supple. No thyromegaly present.  Cardiovascular: Normal rate, regular rhythm, normal heart sounds and intact distal pulses.  No murmur heard. Pulmonary/Chest: Effort normal and breath sounds normal. No respiratory distress. He has no wheezes.  Abdominal: Soft. Bowel sounds are normal. He exhibits no distension. There is no tenderness.  Musculoskeletal: Normal range of motion. He exhibits no edema or tenderness.  Tenderness, redness, warmth in right great toe  Neurological: He is alert and oriented to person, place, and time. He has normal reflexes. No cranial nerve deficit.  Skin: Skin is warm and dry. No rash noted. No erythema.  Psychiatric: He has a normal mood and affect. His behavior is normal. Judgment and thought content normal.  Vitals reviewed.     BP (!) 136/93   Pulse 89   Temp (!) 97.4 F (36.3 C)  (Oral)   Ht _0  (1.702 m)   Wt 223 lb 12.8 oz (101.5 kg)   BMI 35.05 kg/m      Assessment & Plan:  Ronald Medina comes in today with chief complaint of right foot pain   Diagnosis and orders addressed:  1. Acute gout involving toe of right foot, unspecified cause Low purine diet discussed Limit alcohol  Start Colchicine and motrin  Will hold off on starting allopurinol since this is his first attack RTO as needed and keep follow up with PCP - BMP8+EGFR - Uric acid - colchicine 0.6 MG tablet; Take 1.2 mg then one hour later take 0.6 mg. Max 1.8 mg/day  Dispense: 30 tablet; Refill: [place], FNP

## 2018-02-25 NOTE — Telephone Encounter (Signed)
Patient aware.

## 2018-02-26 LAB — BMP8+EGFR
BUN / CREAT RATIO: 13 (ref 9–20)
BUN: 11 mg/dL (ref 6–24)
CALCIUM: 10 mg/dL (ref 8.7–10.2)
CHLORIDE: 98 mmol/L (ref 96–106)
CO2: 25 mmol/L (ref 20–29)
Creatinine, Ser: 0.87 mg/dL (ref 0.76–1.27)
GFR, EST AFRICAN AMERICAN: 122 mL/min/{1.73_m2} (ref >59)
GFR, EST NON AFRICAN AMERICAN: 106 mL/min/{1.73_m2} (ref >59)
Glucose: 92 mg/dL (ref 65–99)
POTASSIUM: 4.6 mmol/L (ref 3.5–5.2)
Sodium: 138 mmol/L (ref 134–144)

## 2018-02-26 LAB — URIC ACID: Uric Acid: 7.7 mg/dL (ref 3.7–8.6)

## 2018-03-02 ENCOUNTER — Encounter: Payer: Self-pay | Admitting: *Deleted

## 2018-03-08 ENCOUNTER — Other Ambulatory Visit: Payer: Self-pay | Admitting: Family Medicine

## 2018-03-08 ENCOUNTER — Telehealth: Payer: Self-pay

## 2018-03-08 MED ORDER — COLCHICINE 0.6 MG PO TABS: ORAL_TABLET | ORAL | 2 refills | Status: DC

## 2018-03-08 NOTE — Telephone Encounter (Signed)
I sent in a new scrip. Let me know if it isn't approved for his gout.

## 2018-03-08 NOTE — Telephone Encounter (Signed)
Got prior auth for Colchicine which denied but I don't see on med list at all

## 2018-12-13 ENCOUNTER — Encounter: Payer: Self-pay | Admitting: Family Medicine

## 2018-12-13 ENCOUNTER — Ambulatory Visit: Payer: BLUE CROSS/BLUE SHIELD | Admitting: Family Medicine

## 2018-12-13 ENCOUNTER — Other Ambulatory Visit: Payer: Self-pay

## 2018-12-13 NOTE — Progress Notes (Signed)
No answer X 2 at 8:30 & 8:35 WS

## 2018-12-14 ENCOUNTER — Other Ambulatory Visit: Payer: Self-pay

## 2018-12-14 ENCOUNTER — Ambulatory Visit (INDEPENDENT_AMBULATORY_CARE_PROVIDER_SITE_OTHER): Payer: BLUE CROSS/BLUE SHIELD | Admitting: Family Medicine

## 2018-12-14 ENCOUNTER — Encounter: Payer: Self-pay | Admitting: Family Medicine

## 2018-12-14 DIAGNOSIS — R05 Cough: Secondary | ICD-10-CM | POA: Diagnosis not present

## 2018-12-14 DIAGNOSIS — G44019 Episodic cluster headache, not intractable: Secondary | ICD-10-CM

## 2018-12-14 DIAGNOSIS — R058 Other specified cough: Secondary | ICD-10-CM

## 2018-12-14 MED ORDER — FROVATRIPTAN SUCCINATE 2.5 MG PO TABS: ORAL_TABLET | ORAL | 2 refills | Status: DC

## 2018-12-14 NOTE — Progress Notes (Signed)
Subjective:    Patient ID: Ronald Medina, male    DOB: 12/28/1975, 43 y.o.   MRN: [id number]   HPI: Ronald Medina is a 43 y.o. male presenting for follow up of cluster migraines dx 15 years ago. Gets several over a few weeks, then will go away for 2-3 years. Then they come back. Starts in the ear. Then a toohache feeling, then sinus pain. Then a sharp throbbing in eye. Always on the right side of the head. Last 1-2 hours then feels sore. Gets them daily during a cluster. Have responded well to frova. Pt. Has noticed a dry cough for several months. No chest congestion.   [facility] 02/25/2018 01/18/2018 11/20/2016 08/20/2016 10/10/2015  Decreased Interest 0 0 0 0 0  Down, Depressed, Hopeless 0 0 0 0 0  PHQ - 2 Score 0 0 0 0 0     Relevant past medical, surgical, family and social history reviewed and updated as indicated.  Interim medical history since our last visit reviewed. Allergies and medications reviewed and updated.  ROS:  Review of Systems   Social History   Tobacco Use  Smoking Status Former Smoker  Smokeless Tobacco Never Used       Objective:     Wt Readings from Last 3 Encounters:  02/25/18 223 lb 12.8 oz (101.5 kg)  01/18/18 224 lb (101.6 kg)  12/31/16 212 lb (96.2 kg)     Exam deferred. Pt. Harboring due to COVID 19. Phone visit performed.   Assessment & Plan:   1. Allergic cough   2. Episodic cluster headache, not intractable     Meds ordered this encounter  Medications  . frovatriptan (FROVA) 2.5 MG tablet    Sig: TAKE 1 TAB BY MOUTH AS NEEDED FOR MIGRAINE. IF RECURS, MAY REPEAT IN 2HOURS. MAX OF 3TABS IN 24HOURS    Dispense:  9 tablet    Refill:  2    Orders Placed This Encounter  Procedures  . Ambulatory referral to Allergy    Referral Priority:   Routine    Referral Type:   Allergy Testing    Referral Reason:   Specialty Services Required    Requested Specialty:   Allergy    Number of Visits Requested:   1      Diagnoses  and all orders for this visit:  Allergic cough -     Ambulatory referral to Allergy  Episodic cluster headache, not intractable -     Ambulatory referral to Allergy  Other orders -     frovatriptan (FROVA) 2.5 MG tablet; TAKE 1 TAB BY MOUTH AS NEEDED FOR MIGRAINE. IF RECURS, MAY REPEAT IN 2HOURS. MAX OF 3TABS IN 24HOURS    Virtual Visit via telephone Note  I discussed the limitations, risks, security and privacy concerns of performing an evaluation and management service by telephone and the availability of in person appointments. The patient was identified with two identifiers. Pt.expressed understanding and agreed to proceed. Pt. Is at home. Dr. Darlyn ReadStacks is in his office.  Follow Up Instructions:   I discussed the assessment and treatment plan with the patient. The patient was provided an opportunity to ask questions and all were answered. The patient agreed with the plan and demonstrated an understanding of the instructions.   The patient was advised to call back or seek an in-person evaluation if the symptoms worsen or if the condition fails to improve as anticipated.   Total minutes including chart review  and phone contact time: 18   Follow up plan: Return in about 6 months (around 06/15/2019), or if symptoms worsen or fail to improve.  Mechele Claude, MD [place] [facility]

## 2018-12-16 ENCOUNTER — Telehealth: Payer: Self-pay | Admitting: Family Medicine

## 2018-12-16 DIAGNOSIS — G44019 Episodic cluster headache, not intractable: Secondary | ICD-10-CM

## 2018-12-16 MED ORDER — SUMATRIPTAN SUCCINATE 25 MG PO TABS: 25.0000 mg | ORAL_TABLET | Freq: Once | ORAL | 0 refills | Status: DC | PRN

## 2018-12-16 NOTE — Telephone Encounter (Signed)
Per pt Frova is not covered by insurance Please change to something else

## 2018-12-16 NOTE — Telephone Encounter (Signed)
Frova was not covered by insurance. Will call in Imitrex. One dose at onset of headache, may repeat in 2 hours x 1 if still symptomatic.

## 2018-12-16 NOTE — Telephone Encounter (Signed)
Pharmacy [facility]   Pt had televist with Dr Darlyn Read recently and forgot to mention that the health nurse at his work prescribed him lisinopril 10 MG take one in morning, and he is out and that nurse is out on maternity leave, wants to know if Dr Darlyn Read will give him a refill. Advised pt that he may have to do another televisit but would send to dr [employer] first

## 2018-12-17 ENCOUNTER — Other Ambulatory Visit: Payer: Self-pay | Admitting: Family Medicine

## 2018-12-17 MED ORDER — LISINOPRIL 10 MG PO TABS: 10.0000 mg | ORAL_TABLET | Freq: Every day | ORAL | 3 refills | Status: DC

## 2018-12-20 ENCOUNTER — Other Ambulatory Visit: Payer: Self-pay | Admitting: *Deleted

## 2018-12-27 ENCOUNTER — Telehealth: Payer: Self-pay | Admitting: Family Medicine

## 2018-12-27 ENCOUNTER — Other Ambulatory Visit: Payer: Self-pay | Admitting: Family Medicine

## 2018-12-27 DIAGNOSIS — G44019 Episodic cluster headache, not intractable: Secondary | ICD-10-CM

## 2018-12-27 NOTE — Telephone Encounter (Signed)
Pharmacy [facility] [place]  Pt is needing a refill on SUMAtriptan (IMITREX) 25 MG tablet

## 2018-12-27 NOTE — Telephone Encounter (Signed)
Pt aware refill was sent in to [facility] on 12/16/18

## 2019-01-11 ENCOUNTER — Other Ambulatory Visit: Payer: Self-pay | Admitting: Family Medicine

## 2019-01-11 DIAGNOSIS — G44019 Episodic cluster headache, not intractable: Secondary | ICD-10-CM

## 2019-01-11 DIAGNOSIS — N529 Male erectile dysfunction, unspecified: Secondary | ICD-10-CM

## 2019-06-22 ENCOUNTER — Ambulatory Visit: Payer: Self-pay | Admitting: Family Medicine

## 2019-06-23 ENCOUNTER — Encounter: Payer: Self-pay | Admitting: Family Medicine

## 2019-06-23 ENCOUNTER — Ambulatory Visit (INDEPENDENT_AMBULATORY_CARE_PROVIDER_SITE_OTHER): Payer: BC Managed Care – PPO | Admitting: Family Medicine

## 2019-06-23 DIAGNOSIS — I10 Essential (primary) hypertension: Secondary | ICD-10-CM | POA: Diagnosis not present

## 2019-06-23 DIAGNOSIS — T464X5A Adverse effect of angiotensin-converting-enzyme inhibitors, initial encounter: Secondary | ICD-10-CM | POA: Diagnosis not present

## 2019-06-23 MED ORDER — NIFEDIPINE ER OSMOTIC RELEASE 30 MG PO TB24: 30.0000 mg | ORAL_TABLET | Freq: Every day | ORAL | 1 refills | Status: DC

## 2019-06-23 NOTE — Progress Notes (Signed)
    Subjective:    Patient ID: Ronald Medina, male    DOB: 09-27-1975, 43 y.o.   MRN: [id number]   HPI: Ronald Medina is a 43 y.o. male presenting for blood pressure high at work. PRovider there started him on lisinopril. Developed a cough. Discontinued it and cough went away but now the BP is 170/120. There is no history of headache chest pain or shortness of breath ,focal numbness or weakness.    Depression screen [facility] 2/9 02/25/2018 01/18/2018 11/20/2016 08/20/2016 10/10/2015  Decreased Interest 0 0 0 0 0  Down, Depressed, Hopeless 0 0 0 0 0  PHQ - 2 Score 0 0 0 0 0     Relevant past medical, surgical, family and social history reviewed and updated as indicated.  Interim medical history since our last visit reviewed. Allergies and medications reviewed and updated.  ROS:  Review of Systems  Constitutional: Negative for fever.  Respiratory: Positive for cough. Negative for shortness of breath.   Cardiovascular: Negative for chest pain.  Musculoskeletal: Negative for arthralgias.  Skin: Negative for rash.     Social History   Tobacco Use  Smoking Status Former Smoker  Smokeless Tobacco Never Used       Objective:     Wt Readings from Last 3 Encounters:  02/25/18 223 lb 12.8 oz (101.5 kg)  01/18/18 224 lb (101.6 kg)  12/31/16 212 lb (96.2 kg)     Exam deferred. Pt. Harboring due to COVID 19. Phone visit performed.   Assessment & Plan:   1. Adverse effect of angiotensin-converting enzyme inhibitor, initial encounter   2. Essential hypertension     Meds ordered this encounter  Medications  . NIFEdipine (PROCARDIA-XL/NIFEDICAL-XL) 30 MG 24 hr tablet    Sig: Take 1 tablet (30 mg total) by mouth daily.    Dispense:  30 tablet    Refill:  1    Diagnoses and all orders for this visit:  Adverse effect of angiotensin-converting enzyme inhibitor, initial encounter  Essential hypertension  Other orders -     NIFEdipine (PROCARDIA-XL/NIFEDICAL-XL) 30 MG 24 hr tablet; Take  1 tablet (30 mg total) by mouth daily.    Virtual Visit via telephone Note  I discussed the limitations, risks, security and privacy concerns of performing an evaluation and management service by telephone and the availability of in person appointments. The patient was identified with two identifiers. Pt.expressed understanding and agreed to proceed. Pt. Is at home. Dr. Livia Snellen is in his office.  Follow Up Instructions:   I discussed the assessment and treatment plan with the patient. The patient was provided an opportunity to ask questions and all were answered. The patient agreed with the plan and demonstrated an understanding of the instructions.   The patient was advised to call back or seek an in-person evaluation if the symptoms worsen or if the condition fails to improve as anticipated.   Total minutes including chart review and phone contact time: 19   Follow up plan: Return in about 1 month (around 07/24/2019).  Claretta Fraise, MD [place]

## 2019-07-18 ENCOUNTER — Other Ambulatory Visit: Payer: Self-pay

## 2019-07-19 ENCOUNTER — Ambulatory Visit: Payer: BC Managed Care – PPO | Admitting: Family Medicine

## 2019-07-19 ENCOUNTER — Encounter: Payer: Self-pay | Admitting: Family Medicine

## 2019-07-19 DIAGNOSIS — N529 Male erectile dysfunction, unspecified: Secondary | ICD-10-CM | POA: Diagnosis not present

## 2019-07-19 DIAGNOSIS — G44019 Episodic cluster headache, not intractable: Secondary | ICD-10-CM

## 2019-07-19 DIAGNOSIS — E785 Hyperlipidemia, unspecified: Secondary | ICD-10-CM | POA: Diagnosis not present

## 2019-07-19 MED ORDER — SILDENAFIL CITRATE 100 MG PO TABS: ORAL_TABLET | ORAL | 2 refills | Status: DC

## 2019-07-19 MED ORDER — SUMATRIPTAN SUCCINATE 25 MG PO TABS: 25.0000 mg | ORAL_TABLET | Freq: Once | ORAL | 0 refills | Status: DC | PRN

## 2019-07-19 MED ORDER — COLCHICINE 0.6 MG PO TABS: ORAL_TABLET | ORAL | 2 refills | Status: DC

## 2019-07-19 MED ORDER — ATORVASTATIN CALCIUM 20 MG PO TABS: 20.0000 mg | ORAL_TABLET | Freq: Every day | ORAL | 3 refills | Status: AC

## 2019-07-19 NOTE — Progress Notes (Signed)
Subjective:  Patient ID: Ronald Medina, male    DOB: 09/29/1975  Age: 44 y.o. MRN: [id number]  CC: Hypertension and Medical Management of Chronic Issues   HPI Ronald Medina presents for follow-up of elevated cholesterol. Doing well without complaints on current medication. Denies side effects of statin including myalgia and arthralgia and nausea. Also in today for liver function testing. Currently no chest pain, shortness of breath or other cardiovascular related symptoms noted. History Ronald Medina has a past medical history of Hyperlipidemia and Migraines.   Ronald Medina has no past surgical history on file.   His family history includes Healthy in his brother, father, mother, and sister.Ronald Medina reports that Ronald Medina has quit smoking. Ronald Medina has never used smokeless tobacco. Ronald Medina reports current alcohol use. Ronald Medina reports that Ronald Medina does not use drugs.  No current outpatient medications on file prior to visit.   No current facility-administered medications on file prior to visit.    ROS Review of Systems  Constitutional: Negative.   HENT: Negative.   Eyes: Negative for visual disturbance.  Respiratory: Negative for cough and shortness of breath.   Cardiovascular: Negative for chest pain and leg swelling.  Gastrointestinal: Negative for abdominal pain, diarrhea, nausea and vomiting.  Genitourinary: Negative for difficulty urinating.  Musculoskeletal: Negative for arthralgias and myalgias.  Skin: Negative for rash.  Neurological: Negative for headaches.  Psychiatric/Behavioral: Negative for sleep disturbance.    Objective:  BP 128/70   Pulse (!) 105   Temp 97.8 F (36.6 C) (Temporal)   Ht 5\' 7"  (1.702 m)   Wt 241 lb 6.4 oz (109.5 kg)   SpO2 100%   BMI 37.81 kg/m   BP Readings from Last 3 Encounters:  07/19/19 128/70  02/25/18 (!) 136/93  01/18/18 134/89    Wt Readings from Last 3 Encounters:  07/19/19 241 lb 6.4 oz (109.5 kg)  02/25/18 223 lb 12.8 oz (101.5 kg)  01/18/18 224 lb (101.6 kg)      Physical Exam Vitals reviewed.  Constitutional:      Appearance: Ronald Medina is well-developed.  HENT:     Head: Normocephalic and atraumatic.     Right Ear: External ear normal.     Left Ear: External ear normal.     Mouth/Throat:     Pharynx: No oropharyngeal exudate or posterior oropharyngeal erythema.  Eyes:     Pupils: Pupils are equal, round, and reactive to light.  Cardiovascular:     Rate and Rhythm: Normal rate and regular rhythm.     Heart sounds: No murmur.  Pulmonary:     Effort: No respiratory distress.     Breath sounds: Normal breath sounds.  Musculoskeletal:     Cervical back: Normal range of motion and neck supple.  Neurological:     Mental Status: Ronald Medina is alert and oriented to person, place, and time.     Lab Results  Component Value Date   HGBA1C 5.2 04/30/2015    Lab Results  Component Value Date   GLUCOSE 92 02/25/2018   NA 138 02/25/2018   K 4.6 02/25/2018   CL 98 02/25/2018   CREATININE 0.87 02/25/2018   BUN 11 02/25/2018   CO2 25 02/25/2018   HGBA1C 5.2 04/30/2015    Patient was never admitted.  Assessment & Plan:   Ronald Medina was seen today for hypertension and medical management of chronic issues.  Diagnoses and all orders for this visit:  Hyperlipidemia LDL goal <130 Comments: LDL and triglycerides elevated on labs Ronald Medina didn't work, will  start atorvastatin Orders: -     atorvastatin (LIPITOR) 20 MG tablet; Take 1 tablet (20 mg total) by mouth daily. -     CBC -     CMP -     Lipid  Erectile dysfunction, unspecified erectile dysfunction type Comments: Gave samples for Cialis but sent a prescription for Viagra and Ronald Medina will try them Orders: -     sildenafil (VIAGRA) 100 MG tablet; TAKE 1/2 TO 1 TABLET DAILY AS NEEDED FOR ERECTILE DYSFUNCTION  Episodic cluster headache, not intractable -     SUMAtriptan (IMITREX) 25 MG tablet; Take 1 tablet (25 mg total) by mouth once as needed for up to 1 dose for migraine (may repeat x 1 after 2 hours if  still symptomatic). May repeat in 2 hours if headache persists or recurs. -     CBC -     CMP -     Lipid  Other orders -     colchicine 0.6 MG tablet; Take twice daily for gout attack. (may take every two hours up to 6 doses at acute onset)   I have discontinued [address]'s neomycin-polymyxin-hydrocortisone, diclofenac, and NIFEdipine. I am also having him maintain his atorvastatin, sildenafil, SUMAtriptan, and colchicine.  Meds ordered this encounter  Medications  . atorvastatin (LIPITOR) 20 MG tablet    Sig: Take 1 tablet (20 mg total) by mouth daily.    Dispense:  90 tablet    Refill:  3  . sildenafil (VIAGRA) 100 MG tablet    Sig: TAKE 1/2 TO 1 TABLET DAILY AS NEEDED FOR ERECTILE DYSFUNCTION    Dispense:  5 tablet    Refill:  2  . SUMAtriptan (IMITREX) 25 MG tablet    Sig: Take 1 tablet (25 mg total) by mouth once as needed for up to 1 dose for migraine (may repeat x 1 after 2 hours if still symptomatic). May repeat in 2 hours if headache persists or recurs.    Dispense:  10 tablet    Refill:  0    DX Code Needed  .  . colchicine 0.6 MG tablet    Sig: Take twice daily for gout attack. (may take every two hours up to 6 doses at acute onset)    Dispense:  60 tablet    Refill:  2   DASH printout discussed, printed for pt.  Follow-up: No follow-ups on file.  Claretta Fraise, M.D.

## 2019-07-19 NOTE — Patient Instructions (Signed)
DASH Eating Plan DASH stands for "Dietary Approaches to Stop Hypertension." The DASH eating plan is a healthy eating plan that has been shown to reduce high blood pressure (hypertension). It may also reduce your risk for type 2 diabetes, heart disease, and stroke. The DASH eating plan may also help with weight loss. What are tips for following this plan?  General guidelines  Avoid eating more than 2,300 mg (milligrams) of salt (sodium) a day. If you have hypertension, you may need to reduce your sodium intake to 1,500 mg a day.  Limit alcohol intake to no more than 1 drink a day for nonpregnant women and 2 drinks a day for men. One drink equals 12 oz of beer, 5 oz of wine, or 1 oz of hard liquor.  Work with your health care provider to maintain a healthy body weight or to lose weight. Ask what an ideal weight is for you.  Get at least 30 minutes of exercise that causes your heart to beat faster (aerobic exercise) most days of the week. Activities may include walking, swimming, or biking.  Work with your health care provider or diet and nutrition specialist (dietitian) to adjust your eating plan to your individual calorie needs. Reading food labels   Check food labels for the amount of sodium per serving. Choose foods with less than 5 percent of the Daily Value of sodium. Generally, foods with less than 300 mg of sodium per serving fit into this eating plan.  To find whole grains, look for the word "whole" as the first word in the ingredient list. Shopping  Buy products labeled as "low-sodium" or "no salt added."  Buy fresh foods. Avoid canned foods and premade or frozen meals. Cooking  Avoid adding salt when cooking. Use salt-free seasonings or herbs instead of table salt or sea salt. Check with your health care provider or pharmacist before using salt substitutes.  Do not fry foods. Cook foods using healthy methods such as baking, boiling, grilling, and broiling instead.  Cook with  heart-healthy oils, such as olive, canola, soybean, or sunflower oil. Meal planning  Eat a balanced diet that includes: ? 5 or more servings of fruits and vegetables each day. At each meal, try to fill half of your plate with fruits and vegetables. ? Up to 6-8 servings of whole grains each day. ? Less than 6 oz of lean meat, poultry, or fish each day. A 3-oz serving of meat is about the same size as a deck of cards. One egg equals 1 oz. ? 2 servings of low-fat dairy each day. ? A serving of nuts, seeds, or beans 5 times each week. ? Heart-healthy fats. Healthy fats called Omega-3 fatty acids are found in foods such as flaxseeds and coldwater fish, like sardines, salmon, and mackerel.  Limit how much you eat of the following: ? Canned or prepackaged foods. ? Food that is high in trans fat, such as fried foods. ? Food that is high in saturated fat, such as fatty meat. ? Sweets, desserts, sugary drinks, and other foods with added sugar. ? Full-fat dairy products.  Do not salt foods before eating.  Try to eat at least 2 vegetarian meals each week.  Eat more home-cooked food and less restaurant, buffet, and fast food.  When eating at a restaurant, ask that your food be prepared with less salt or no salt, if possible. What foods are recommended? The items listed may not be a complete list. Talk with your dietitian about   what dietary choices are best for you. Grains Whole-grain or whole-wheat bread. Whole-grain or whole-wheat pasta. Brown rice. Oatmeal. Quinoa. Bulgur. Whole-grain and low-sodium cereals. Pita bread. Low-fat, low-sodium crackers. Whole-wheat flour tortillas. Vegetables Fresh or frozen vegetables (raw, steamed, roasted, or grilled). Low-sodium or reduced-sodium tomato and vegetable juice. Low-sodium or reduced-sodium tomato sauce and tomato paste. Low-sodium or reduced-sodium canned vegetables. Fruits All fresh, dried, or frozen fruit. Canned fruit in natural juice (without  added sugar). Meat and other protein foods Skinless chicken or turkey. Ground chicken or turkey. Pork with fat trimmed off. Fish and seafood. Egg whites. Dried beans, peas, or lentils. Unsalted nuts, nut butters, and seeds. Unsalted canned beans. Lean cuts of beef with fat trimmed off. Low-sodium, lean deli meat. Dairy Low-fat (1%) or fat-free (skim) milk. Fat-free, low-fat, or reduced-fat cheeses. Nonfat, low-sodium ricotta or cottage cheese. Low-fat or nonfat yogurt. Low-fat, low-sodium cheese. Fats and oils Soft margarine without trans fats. Vegetable oil. Low-fat, reduced-fat, or light mayonnaise and salad dressings (reduced-sodium). Canola, safflower, olive, soybean, and sunflower oils. Avocado. Seasoning and other foods Herbs. Spices. Seasoning mixes without salt. Unsalted popcorn and pretzels. Fat-free sweets. What foods are not recommended? The items listed may not be a complete list. Talk with your dietitian about what dietary choices are best for you. Grains Baked goods made with fat, such as croissants, muffins, or some breads. Dry pasta or rice meal packs. Vegetables Creamed or fried vegetables. Vegetables in a cheese sauce. Regular canned vegetables (not low-sodium or reduced-sodium). Regular canned tomato sauce and paste (not low-sodium or reduced-sodium). Regular tomato and vegetable juice (not low-sodium or reduced-sodium). Pickles. Olives. Fruits Canned fruit in a light or heavy syrup. Fried fruit. Fruit in cream or butter sauce. Meat and other protein foods Fatty cuts of meat. Ribs. Fried meat. Bacon. Sausage. Bologna and other processed lunch meats. Salami. Fatback. Hotdogs. Bratwurst. Salted nuts and seeds. Canned beans with added salt. Canned or smoked fish. Whole eggs or egg yolks. Chicken or turkey with skin. Dairy Whole or 2% milk, cream, and half-and-half. Whole or full-fat cream cheese. Whole-fat or sweetened yogurt. Full-fat cheese. Nondairy creamers. Whipped toppings.  Processed cheese and cheese spreads. Fats and oils Butter. Stick margarine. Lard. Shortening. Ghee. Bacon fat. Tropical oils, such as coconut, palm kernel, or palm oil. Seasoning and other foods Salted popcorn and pretzels. Onion salt, garlic salt, seasoned salt, table salt, and sea salt. Worcestershire sauce. Tartar sauce. Barbecue sauce. Teriyaki sauce. Soy sauce, including reduced-sodium. Steak sauce. Canned and packaged gravies. Fish sauce. Oyster sauce. Cocktail sauce. Horseradish that you find on the shelf. Ketchup. Mustard. Meat flavorings and tenderizers. Bouillon cubes. Hot sauce and Tabasco sauce. Premade or packaged marinades. Premade or packaged taco seasonings. Relishes. Regular salad dressings. Where to find more information:  National Heart, Lung, and Blood Institute: www.nhlbi.nih.gov  American Heart Association: www.heart.org Summary  The DASH eating plan is a healthy eating plan that has been shown to reduce high blood pressure (hypertension). It may also reduce your risk for type 2 diabetes, heart disease, and stroke.  With the DASH eating plan, you should limit salt (sodium) intake to 2,300 mg a day. If you have hypertension, you may need to reduce your sodium intake to 1,500 mg a day.  When on the DASH eating plan, aim to eat more fresh fruits and vegetables, whole grains, lean proteins, low-fat dairy, and heart-healthy fats.  Work with your health care provider or diet and nutrition specialist (dietitian) to adjust your eating plan to your   individual calorie needs. This information is not intended to replace advice given to you by your health care provider. Make sure you discuss any questions you have with your health care provider. Document Revised: 06/12/2017 Document Reviewed: 06/23/2016 Elsevier Patient Education  2020 Elsevier Inc.  

## 2019-08-18 ENCOUNTER — Other Ambulatory Visit: Payer: Self-pay | Admitting: Family Medicine

## 2019-08-18 DIAGNOSIS — G44019 Episodic cluster headache, not intractable: Secondary | ICD-10-CM

## 2019-08-29 ENCOUNTER — Telehealth: Payer: Self-pay | Admitting: Physician Assistant

## 2019-08-29 NOTE — Telephone Encounter (Signed)
Pt would like to transfer care to [facility], ok with TOC?

## 2019-08-29 NOTE — Telephone Encounter (Signed)
Would be NP appointment. No TOC permission required since he is being seen by practice outside of [place].

## 2019-08-29 NOTE — Telephone Encounter (Signed)
Called pt to make him aware of this but was unable to reach pt due to VM being full///ELEA

## 2019-09-02 DIAGNOSIS — I10 Essential (primary) hypertension: Secondary | ICD-10-CM | POA: Diagnosis not present

## 2019-09-02 DIAGNOSIS — N529 Male erectile dysfunction, unspecified: Secondary | ICD-10-CM | POA: Diagnosis not present

## 2019-09-02 DIAGNOSIS — G44009 Cluster headache syndrome, unspecified, not intractable: Secondary | ICD-10-CM | POA: Diagnosis not present

## 2019-09-02 DIAGNOSIS — E782 Mixed hyperlipidemia: Secondary | ICD-10-CM | POA: Diagnosis not present

## 2019-09-05 IMAGING — DX DG KNEE 1-2V*R*
2 series · 2 of 2 positions shown · non-contrast
Comparison: 06/05/2015

CLINICAL DATA: Acute on chronic right knee pain.

EXAM:
RIGHT KNEE - 1-2 VIEW

[knee ap]
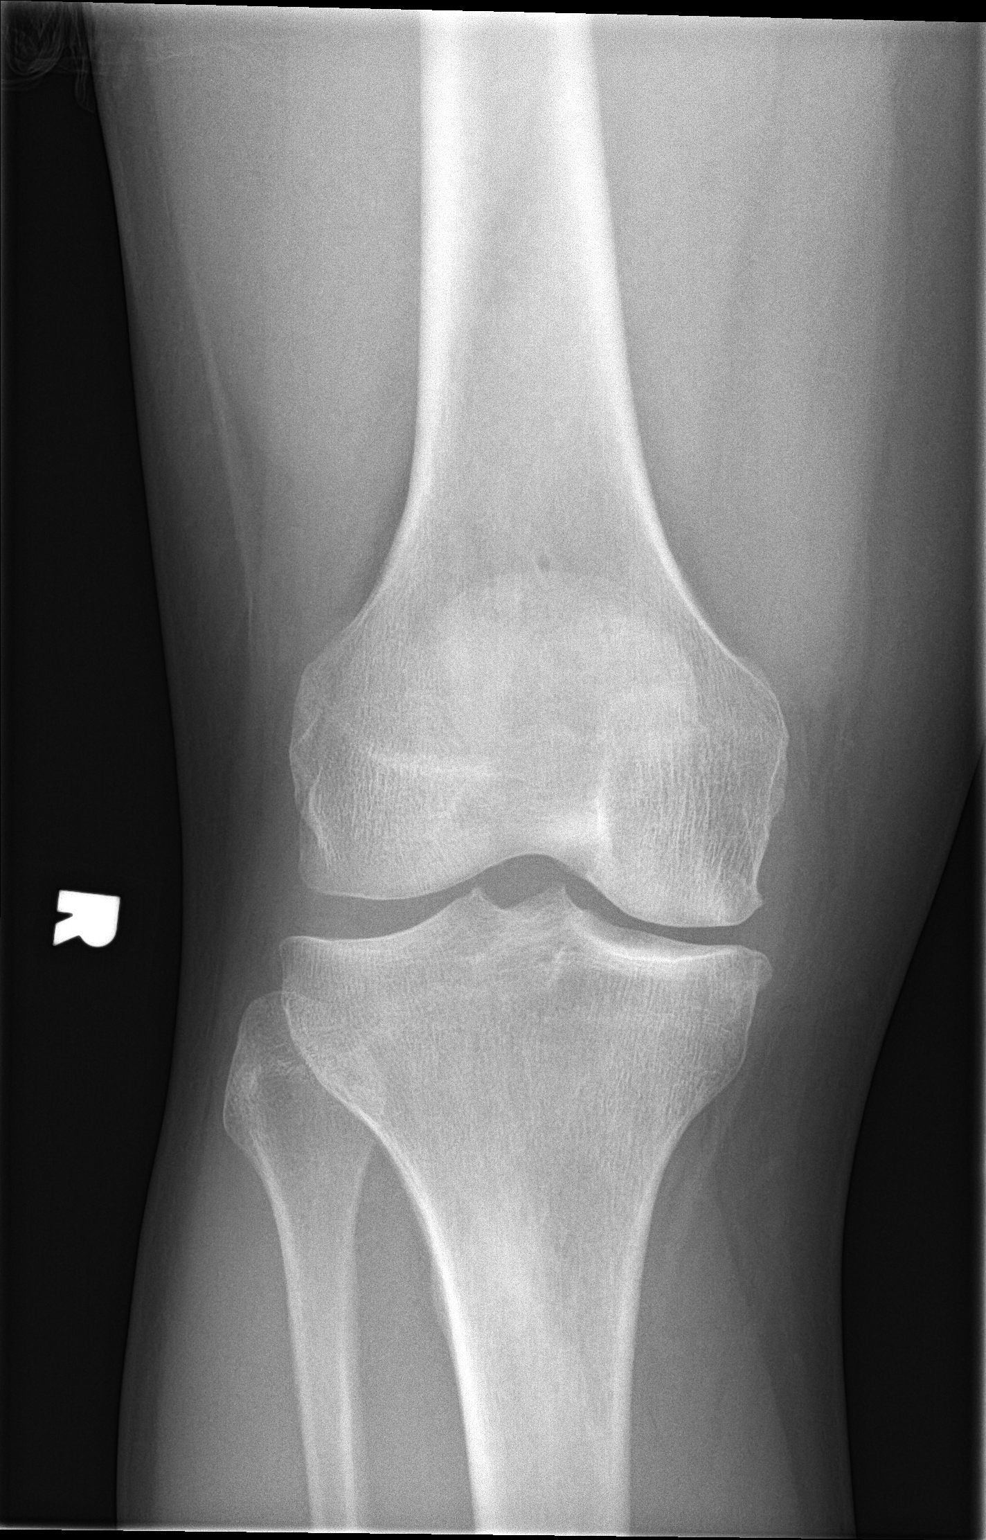

[knee lat]
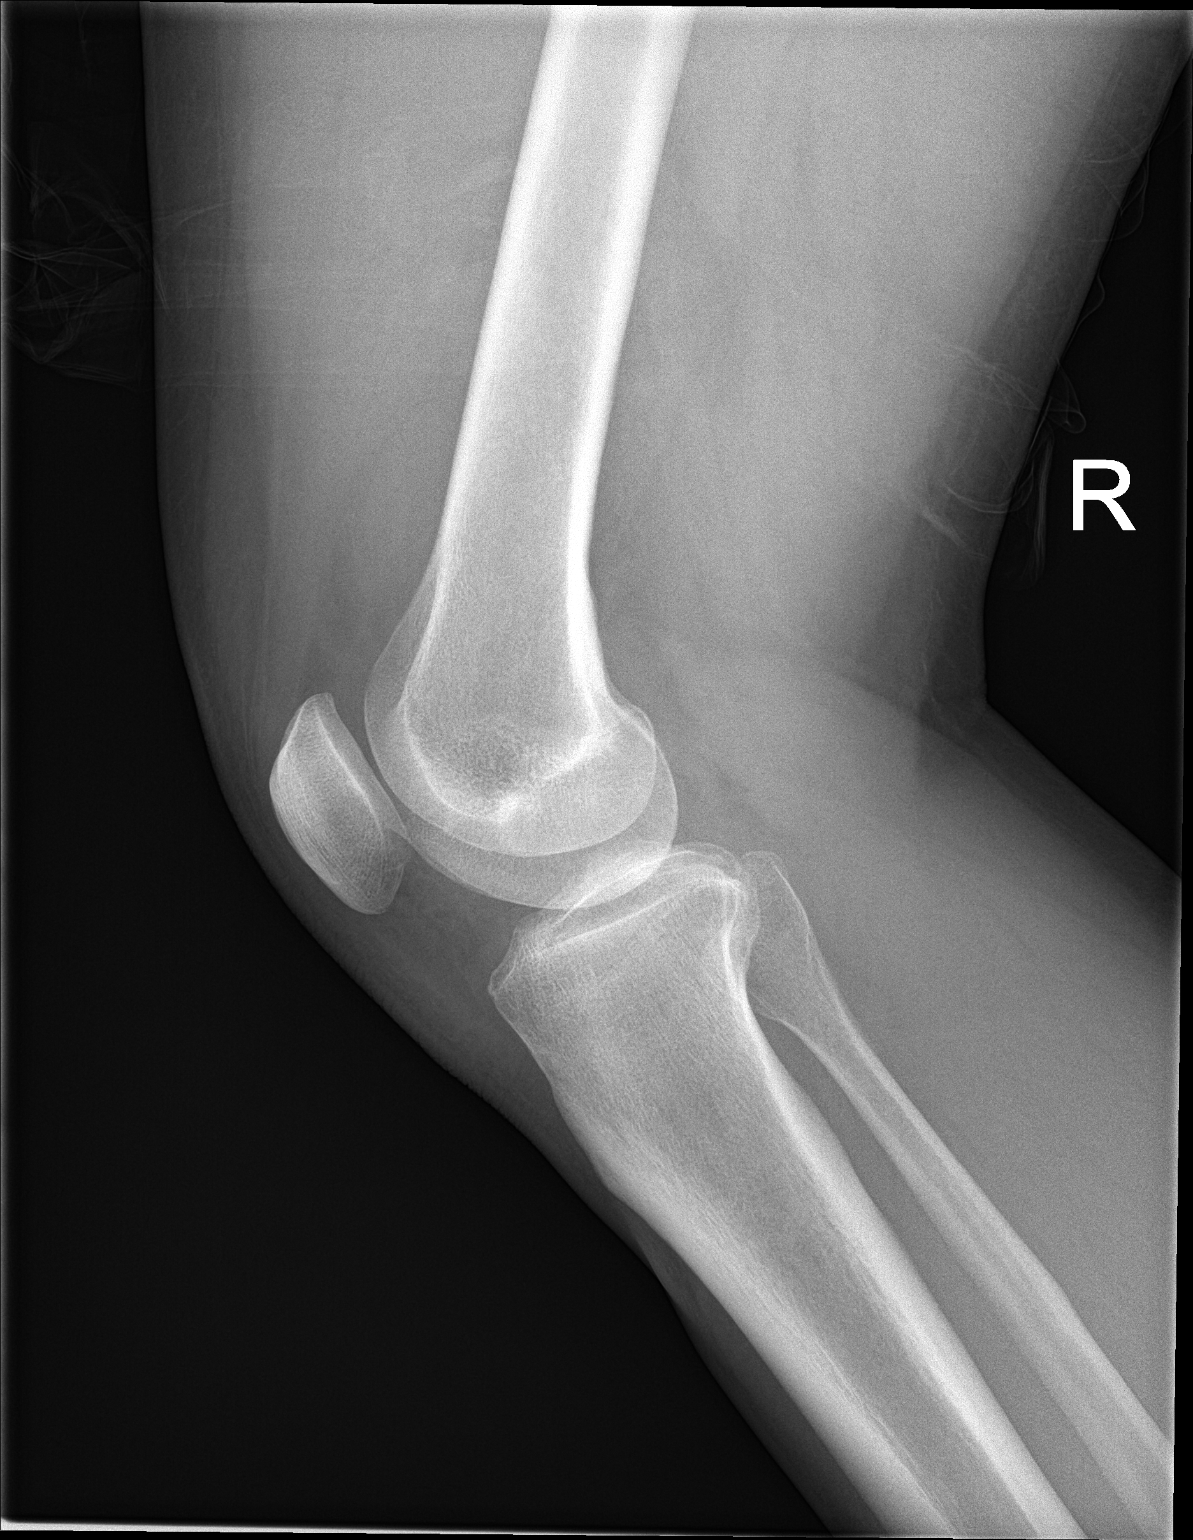

[2 of 2 positions shown; findings below may reference images not displayed]

FINDINGS: No evidence of fracture, dislocation, or joint effusion. Mild medial
compartment osteoarthritis shows mild progression since previous
study. No other osseous abnormality identified
IMPRESSION: Mild medial compartment osteoarthritis, with mild progression since
prior study.

## 2019-10-14 DIAGNOSIS — E782 Mixed hyperlipidemia: Secondary | ICD-10-CM | POA: Diagnosis not present

## 2019-10-14 DIAGNOSIS — I10 Essential (primary) hypertension: Secondary | ICD-10-CM | POA: Diagnosis not present

## 2019-10-20 DIAGNOSIS — Z23 Encounter for immunization: Secondary | ICD-10-CM | POA: Diagnosis not present

## 2019-11-11 DIAGNOSIS — Z23 Encounter for immunization: Secondary | ICD-10-CM | POA: Diagnosis not present

## 2020-01-25 ENCOUNTER — Other Ambulatory Visit: Payer: Self-pay | Admitting: Family Medicine

## 2020-01-25 DIAGNOSIS — N529 Male erectile dysfunction, unspecified: Secondary | ICD-10-CM

## 2020-04-27 DIAGNOSIS — I10 Essential (primary) hypertension: Secondary | ICD-10-CM | POA: Diagnosis not present

## 2020-04-27 DIAGNOSIS — R7301 Impaired fasting glucose: Secondary | ICD-10-CM | POA: Diagnosis not present

## 2020-04-27 DIAGNOSIS — Z87891 Personal history of nicotine dependence: Secondary | ICD-10-CM | POA: Diagnosis not present

## 2020-04-27 DIAGNOSIS — Z1159 Encounter for screening for other viral diseases: Secondary | ICD-10-CM | POA: Diagnosis not present

## 2020-04-27 DIAGNOSIS — G44009 Cluster headache syndrome, unspecified, not intractable: Secondary | ICD-10-CM | POA: Diagnosis not present

## 2020-04-27 DIAGNOSIS — Z0000 Encounter for general adult medical examination without abnormal findings: Secondary | ICD-10-CM | POA: Diagnosis not present

## 2020-04-27 DIAGNOSIS — Z23 Encounter for immunization: Secondary | ICD-10-CM | POA: Diagnosis not present

## 2020-04-27 DIAGNOSIS — M109 Gout, unspecified: Secondary | ICD-10-CM | POA: Diagnosis not present

## 2020-04-27 DIAGNOSIS — E782 Mixed hyperlipidemia: Secondary | ICD-10-CM | POA: Diagnosis not present

## 2020-07-25 DIAGNOSIS — Z20828 Contact with and (suspected) exposure to other viral communicable diseases: Secondary | ICD-10-CM | POA: Diagnosis not present

## 2020-07-26 ENCOUNTER — Other Ambulatory Visit: Payer: Self-pay | Admitting: Family Medicine

## 2020-07-30 ENCOUNTER — Other Ambulatory Visit: Payer: Self-pay | Admitting: Family Medicine

## 2020-09-13 DIAGNOSIS — R110 Nausea: Secondary | ICD-10-CM | POA: Diagnosis not present

## 2020-09-13 DIAGNOSIS — Z6836 Body mass index (BMI) 36.0-36.9, adult: Secondary | ICD-10-CM | POA: Diagnosis not present

## 2020-09-13 DIAGNOSIS — M109 Gout, unspecified: Secondary | ICD-10-CM | POA: Diagnosis not present

## 2020-09-13 DIAGNOSIS — R5383 Other fatigue: Secondary | ICD-10-CM | POA: Diagnosis not present

## 2020-11-22 ENCOUNTER — Other Ambulatory Visit: Payer: Self-pay | Admitting: Family Medicine

## 2020-11-22 DIAGNOSIS — N529 Male erectile dysfunction, unspecified: Secondary | ICD-10-CM

## 2021-04-29 DIAGNOSIS — Z79899 Other long term (current) drug therapy: Secondary | ICD-10-CM | POA: Diagnosis not present

## 2021-04-29 DIAGNOSIS — Z2821 Immunization not carried out because of patient refusal: Secondary | ICD-10-CM | POA: Diagnosis not present

## 2021-04-29 DIAGNOSIS — E782 Mixed hyperlipidemia: Secondary | ICD-10-CM | POA: Diagnosis not present

## 2021-04-29 DIAGNOSIS — Z1211 Encounter for screening for malignant neoplasm of colon: Secondary | ICD-10-CM | POA: Diagnosis not present

## 2021-04-29 DIAGNOSIS — Z825 Family history of asthma and other chronic lower respiratory diseases: Secondary | ICD-10-CM | POA: Diagnosis not present

## 2021-04-29 DIAGNOSIS — Z0000 Encounter for general adult medical examination without abnormal findings: Secondary | ICD-10-CM | POA: Diagnosis not present

## 2021-04-29 DIAGNOSIS — I10 Essential (primary) hypertension: Secondary | ICD-10-CM | POA: Diagnosis not present

## 2021-04-29 DIAGNOSIS — Z87891 Personal history of nicotine dependence: Secondary | ICD-10-CM | POA: Diagnosis not present

## 2021-04-29 DIAGNOSIS — N529 Male erectile dysfunction, unspecified: Secondary | ICD-10-CM | POA: Diagnosis not present

## 2021-04-29 DIAGNOSIS — Z8249 Family history of ischemic heart disease and other diseases of the circulatory system: Secondary | ICD-10-CM | POA: Diagnosis not present

## 2021-04-29 DIAGNOSIS — M109 Gout, unspecified: Secondary | ICD-10-CM | POA: Diagnosis not present

## 2021-04-29 DIAGNOSIS — G44009 Cluster headache syndrome, unspecified, not intractable: Secondary | ICD-10-CM | POA: Diagnosis not present

## 2021-10-29 ENCOUNTER — Other Ambulatory Visit: Payer: Self-pay | Admitting: Family Medicine

## 2021-10-31 ENCOUNTER — Ambulatory Visit (INDEPENDENT_AMBULATORY_CARE_PROVIDER_SITE_OTHER): Payer: BC Managed Care – PPO | Admitting: Nurse Practitioner

## 2021-10-31 ENCOUNTER — Encounter: Payer: Self-pay | Admitting: Nurse Practitioner

## 2021-10-31 VITALS — BP 141/92 | HR 101 | Temp 97.9°F | Resp 20 | Ht 67.0 in | Wt 233.0 lb

## 2021-10-31 DIAGNOSIS — M10071 Idiopathic gout, right ankle and foot: Secondary | ICD-10-CM

## 2021-10-31 DIAGNOSIS — S90851A Superficial foreign body, right foot, initial encounter: Secondary | ICD-10-CM | POA: Diagnosis not present

## 2021-10-31 MED ORDER — PREDNISONE 20 MG PO TABS: 40.0000 mg | ORAL_TABLET | Freq: Every day | ORAL | 0 refills | Status: AC

## 2021-10-31 MED ORDER — COLCHICINE 0.6 MG PO TABS: ORAL_TABLET | ORAL | 0 refills | Status: AC

## 2021-10-31 NOTE — Progress Notes (Signed)
? ?  Subjective:  ? ? Patient ID: Ronald Medina, male    DOB: 08/09/1975, 46 y.o.   MRN: [id number] ? ? ?Chief Complaint: ? splinter in right foot ? ? ?HPI ?Patient says he was putting out pine needles a month ago and felt some thing stick in his foot. He looked and coud not find anything. Still tender to walk on. He also has a flare up of gout in his right big toe . Gout flared up Monday. Rates pain 7-8/10 currently. ? ? ? ?Review of Systems  ?Constitutional:  Negative for diaphoresis.  ?Eyes:  Negative for pain.  ?Respiratory:  Negative for shortness of breath.   ?Cardiovascular:  Negative for chest pain, palpitations and leg swelling.  ?Gastrointestinal:  Negative for abdominal pain.  ?Endocrine: Negative for polydipsia.  ?Skin:  Negative for rash.  ?Neurological:  Negative for dizziness, weakness and headaches.  ?Hematological:  Does not bruise/bleed easily.  ?All other systems reviewed and are negative. ? ?   ?Objective:  ? Physical Exam ?Constitutional:   ?   Appearance: Normal appearance.  ?Musculoskeletal:  ?   Comments: Right big toe swollen and edematous- extremely tender to touch. ? ?No foreign body visiblein foot.  ?Skin: ?   General: Skin is warm and dry.  ?Neurological:  ?   General: No focal deficit present.  ?   Mental Status: He is alert and oriented to person, place, and time.  ?Psychiatric:     ?   Mood and Affect: Mood normal.     ?   Behavior: Behavior normal.  ? ? ?BP (!) 141/92   Pulse (!) 101   Temp 97.9 ?F (36.6 ?C) (Temporal)   Resp 20   Ht 5\' 7"  (1.702 m)   Wt 233 lb (105.7 kg)   SpO2 94%   BMI 36.49 kg/m?  ? ? ? ?   ?Assessment & Plan:  ? ?Bethann Berkshire in today with chief complaint of ? splinter in right foot ? ? ?1. Foreign body in right foot, initial encounter ?Try fat back meat on area to possibly draw pine needle out ?Watch for signs of infection ? ?2. Acute idiopathic gout involving toe of right foot ?Low purine diet ?Ice bid ? ?Meds ordered this encounter  ?Medications  ?  colchicine 0.6 MG tablet  ?  Sig: 2 tablet sat gout onset. May repeat 1 tablet in 1 hour. No more then 3 tablets in 24 hours  ?  Dispense:  20 tablet  ?  Refill:  0  ?  Order Specific Question:   Supervising Provider  ?  Answer:   Caryl Pina A [id number]  ? predniSONE (DELTASONE) 20 MG tablet  ?  Sig: Take 2 tablets (40 mg total) by mouth daily with breakfast for 5 days. 2 po daily for 5 days  ?  Dispense:  10 tablet  ?  Refill:  0  ?  Order Specific Question:   Supervising Provider  ?  Answer:   Caryl Pina A [id number]  ? ? ? ? ? ?The above assessment and management plan was discussed with the patient. The patient verbalized understanding of and has agreed to the management plan. Patient is aware to call the clinic if symptoms persist or worsen. Patient is aware when to return to the clinic for a follow-up visit. Patient educated on when it is appropriate to go to the emergency department.  ? ?Mary-Margaret Hassell Done, FNP ? ? ?

## 2021-10-31 NOTE — Patient Instructions (Signed)
Sliver Removal, Care After A sliver, also called a splinter, is a small, thin piece of an object that gets stuck (embedded) under your skin. Slivers can create a deep wound that can easily become infected. After removing a sliver from the skin, it is important to care for the wound. A sliver can be made of any material, such as: Wood. Glass. Plastic. Metal. Plant material, such as a thorn. Slivers often break into smaller pieces when they enter the skin. If pieces of the sliver broke off and stayed in your skin, the area may feel tender. In time you may see them working themselves out. This is normal. The following information offers guidance on how to care for yourself after your procedure. Your health care provider may also give you more specific instructions. If you have problems or questions, contact your health care provider. What can I expect after the procedure? After a sliver has been removed, it is common to have some pain at the wound site. This is especially common if pieces of the sliver remained in your skin. These pieces usually come out on their own. Follow these instructions at home: Wound care  Follow instructions from your health care provider about how to take care of your wound. Make sure you: Wash your hands with soap and water for at least 20 seconds before and after you change your bandage (dressing). If soap and water are not available, use hand sanitizer. Change your dressing as told by your health care provider. Check your wound every day for signs of infection. Check for: Redness, swelling, or pain. Fluid or blood. Pus or a bad smell. New warmth, a rash, or hardness at the wound site. Do not take baths, swim, or use a hot tub until your health care provider approves. General instructions Take over-the-counter and prescription medicines only as told by your health care provider. If you were prescribed an antibiotic medicine, take or apply it as told by your health  care provider. Do not stop using the antibiotic even if you start to feel better. If directed, put ice on the painful area. To do this: Put ice in a plastic bag. Place a towel between your skin and the bag. Leave the ice on for 20 minutes, 2-3 times a day. Remove the ice if your skin turns bright red. This is very important. If you cannot feel pain, heat, or cold, you have a greater risk of damage to the area. Keep all follow-up visits. This is important. Contact a health care provider if: You think that a piece of the sliver is still in your skin, or you notice something coming out of the wound. You have signs of infection, including: New or worsening redness, swelling, or pain around the wound. New warmth, a rash, or hardness at the wound site. Fluid, blood, or pus coming from the wound. A bad smell coming from the wound or dressing. You received a tetanus shot and you have swelling, severe pain, redness, or bleeding at the injection site. Get help right away if you have: An unexplained fever or chills. Red streaks coming from the wound. Summary A sliver, also called a splinter, is a small, thin piece of an object that gets stuck (embedded) under your skin. After removing a sliver from the skin, it is important to care for the wound. Be sure to contact your health care provider if fluid, blood, or pus is coming from the wound. This information is not intended to replace advice given   to you by your health care provider. Make sure you discuss any questions you have with your health care provider. Document Revised: 10/23/2020 Document Reviewed: 10/23/2020 Elsevier Patient Education  2023 Elsevier Inc.  

## 2022-06-30 DIAGNOSIS — Z760 Encounter for issue of repeat prescription: Secondary | ICD-10-CM | POA: Diagnosis not present

## 2022-06-30 DIAGNOSIS — I10 Essential (primary) hypertension: Secondary | ICD-10-CM | POA: Diagnosis not present

## 2022-07-01 DIAGNOSIS — Z8679 Personal history of other diseases of the circulatory system: Secondary | ICD-10-CM | POA: Diagnosis not present

## 2022-07-23 ENCOUNTER — Ambulatory Visit: Payer: BC Managed Care – PPO | Admitting: Family Medicine

## 2022-07-23 ENCOUNTER — Encounter: Payer: Self-pay | Admitting: Family Medicine

## 2022-07-23 VITALS — BP 143/91 | HR 90 | Temp 98.0°F | Ht 67.0 in | Wt 240.2 lb

## 2022-07-23 DIAGNOSIS — J101 Influenza due to other identified influenza virus with other respiratory manifestations: Secondary | ICD-10-CM | POA: Diagnosis not present

## 2022-07-23 DIAGNOSIS — S39012A Strain of muscle, fascia and tendon of lower back, initial encounter: Secondary | ICD-10-CM

## 2022-07-23 DIAGNOSIS — R6889 Other general symptoms and signs: Secondary | ICD-10-CM | POA: Diagnosis not present

## 2022-07-23 LAB — VERITOR FLU A/B WAIVED
Influenza A: POSITIVE — AB
Influenza B: NEGATIVE

## 2022-07-23 LAB — RSV AG, IMMUNOCHR, WAIVED: RSV Ag, Immunochr, Waived: NEGATIVE

## 2022-07-23 MED ORDER — METHYLPREDNISOLONE ACETATE 40 MG/ML IJ SUSP
40.0000 mg | Freq: Once | INTRAMUSCULAR | Status: AC
  Administered 2022-07-23: 40 mg via INTRAMUSCULAR

## 2022-07-23 MED ORDER — PROMETHAZINE-DM 6.25-15 MG/5ML PO SYRP: 2.5000 mL | ORAL_SOLUTION | Freq: Four times a day (QID) | ORAL | 0 refills | Status: AC | PRN

## 2022-07-23 MED ORDER — OSELTAMIVIR PHOSPHATE 75 MG PO CAPS: 75.0000 mg | ORAL_CAPSULE | Freq: Two times a day (BID) | ORAL | 0 refills | Status: AC

## 2022-07-23 MED ORDER — BENZONATATE 100 MG PO CAPS: 100.0000 mg | ORAL_CAPSULE | Freq: Three times a day (TID) | ORAL | 0 refills | Status: AC | PRN

## 2022-07-23 NOTE — Patient Instructions (Signed)

## 2022-07-23 NOTE — Progress Notes (Signed)
I have separately seen and examined the patient. I have discussed the findings and exam with student Dr Ronald Medina and agree with the below note.  My changes/additions are outlined in BLUE.    S: Patient reports 2-day history of myalgia, cough, headache, sinus pressure and drainage.  He has been using Tylenol only for symptoms.  Denies any history of smoking, COPD or asthma.  He has missed the last 2 days of work and has had multiple sick contacts but not sure what they were actually ill with.  He does report some right-sided low back strain from diffuse coughing the other day.  He does not report any saddle anesthesia or urinary retention.  No red flag signs or symptoms but it is uncomfortable.  O: Vitals:   07/23/22 0806 07/23/22 0807  BP: (!) 146/92 (!) 143/91  Pulse: 90   Temp: 98 F (36.7 C)   SpO2: 93%     Gen: Tired and ill-appearing male in no acute distress HEENT: TMs slightly obscured by cerumen bilaterally.  No appreciable purulence at the apex of the TMs.  Nares with clear drainage.  Throat with mild to moderate erythema but no exudates or ulceration.  Moist mucous membranes present. Cardio: Regular rate and rhythm.  No murmurs Pulm: Normal work of breathing on room air.  Clear to auscultation bilaterally with no wheezes, rhonchi or rales.  Does cough intermittently MSK: Lumbar spine is nontender to touch.  No palpable deformities or abnormalities.  Ambulating independently.  A/P:  Influenza A - Plan: Veritor Flu A/B Waived, Novel Coronavirus, NAA ([facility]), RSV Ag, Immunochr, Waived, oseltamivir (TAMIFLU) 75 MG capsule, benzonatate (TESSALON PERLES) 100 MG capsule, promethazine-dextromethorphan (PROMETHAZINE-DM) 6.25-15 MG/5ML syrup, methylPREDNISolone acetate (DEPO-MEDROL) injection 40 mg  Back strain, initial encounter - Plan: methylPREDNISolone acetate (DEPO-MEDROL) injection 40 mg  Positive influenza A.  Negative RSV.  COVID sent for completion.  Tessalon Perles for daytime  use, promethazine with dextromethorphan for evening use.  Depo-Medrol was administered due to back strain which is presumably from cough.  He had no red flag signs or symptoms.  Tamiflu sent for infection.  We discussed that this may cause nausea so to be cautious with that.  Hydrate well.  Follow-up if any red flag signs or symptoms occur.  Work note provided excusing through the week  [employer]. Ronald Medina, [facility]   -------------------------------------------------------------------------------------------------------------------------------------------------------------------------------------      Subjective: CC:Flu-liked symptoms  PCP: Ronald Fraise, MD [name] presenting to clinic today for:  Flu-like symptoms  Patient reports that his symptoms started two days ago. 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 He has had productive cough, sinus Medina, body aches, headache, fatigue, and congestion. Has not had GI symptoms. No recent travel history. He did not receive his flu shot. Reports that he lives at home with his son. Does not smoke.   ROS: Per HPI  No Known Allergies Past Medical History:  Diagnosis Date   Hyperlipidemia    Migraines     Current Outpatient Medications:    atorvastatin (LIPITOR) 20 MG tablet, Take 1 tablet (20 mg total) by mouth daily., Disp: 90 tablet, Rfl: 3   benzonatate (TESSALON PERLES) 100 MG capsule, Take 1 capsule (100 mg total) by mouth 3 (three) times daily as needed for cough., Disp: 20 capsule, Rfl: 0   colchicine 0.6 MG tablet, 2 tablet sat gout onset. May repeat 1 tablet in 1 hour. No more then 3  tablets in 24 hours, Disp: 20 tablet, Rfl: 0   oseltamivir (TAMIFLU) 75 MG capsule, Take 1 capsule (75 mg total) by mouth 2 (two) times daily for 5 days., Disp: 10 capsule, Rfl: 0   promethazine-dextromethorphan (PROMETHAZINE-DM) 6.25-15 MG/5ML syrup, Take 2.5 mLs by mouth 4 (four) times  daily as needed for cough., Disp: 118 mL, Rfl: 0   sildenafil (VIAGRA) 100 MG tablet, TAKE 1/2 TO 1 TABLET DAILY AS NEEDED FOR ERECTILE DYSFUNCTION, Disp: 4 tablet, Rfl: 3   SUMAtriptan (IMITREX) 25 MG tablet, TAKE 1 TABLET ONCE AS NEEDED FOR UP TO 1 DOSE FOR MIGRAINE (MAY REPEAT X 1 AFTER 2 HOURS IF STILL SYMPTOMATIC). MAY REPEAT IN 2 HOURS IF HEADACHE PERSISTS OR RECURS., Disp: 10 tablet, Rfl: 0 Social History   Socioeconomic History   Marital status: Single    Spouse name: Not on file   Number of children: Not on file   Years of education: Not on file   Highest education level: Not on file  Occupational History   Not on file  Tobacco Use   Smoking status: Former   Smokeless tobacco: Never  Substance and Sexual Activity   Alcohol use: Yes    Alcohol/week: 0.0 standard drinks of alcohol    Comment: Occasional   Drug use: No   Sexual activity: Not on file  Other Topics Concern   Not on file  Social History Narrative   Not on file   Social Determinants of Health   Financial Resource Strain: Not on file  Food Insecurity: Not on file  Transportation Needs: Not on file  Physical Activity: Not on file  Stress: Not on file  Social Connections: Not on file  Intimate Partner Violence: Not on file   Family History  Problem Relation Age of Onset   Healthy Mother    Healthy Father    Healthy Sister    Healthy Brother     Objective: Office vital signs reviewed. BP (!) 143/91   Pulse 90   Temp 98 F (36.7 C) (Temporal)   Ht 5\' 7"  (1.702 m)   Wt 240 lb 3.2 oz (109 kg)   SpO2 93%   BMI 37.62 kg/m   Physical Exam General: Awake, alert, No acute distress, appears tired HEENT:     Neck: No masses palpated. No lymphadenopathy    Ears: Tympanic membranes not visible due to wax impaction, normal light reflex, no erythema, no bulging    Eyes: PERRLA, extraocular membranes intact, sclera white    Nose: nasal turbinates moist, erythematous, no nasal discharge.     Throat:  moist mucus membranes, some erythema, no tonsillar exudate.  Airway is patent Cardio: regular rate and rhythm, S1S2 heard, no murmurs appreciated Pulm: clear to auscultation bilaterally, no wheezes, rhonchi or rales; normal work of breathing on room air Extremities: warm, well perfused, No edema, cyanosis or clubbing; +1 pulses bilaterally Skin: dry; intact; no rashes or lesions  Assessment/ Plan: 47 y.o. male   Patient tested positive for Influenza A. Very low likelihood of strep throat given no tonsillar exudates and cough. Will provide cough suppressant medication including Tessalon Pearls for daytime use and Prometh-Dextromethorphan cough syrup for nighttime use. Given that patient is presenting less than 48 hours after the onset of symptoms, oseltamivir 75 mg orally twice daily for 5 days is appropriate at this time.   Discussed warning signs that should prompt the patient to seek immediate medical attention, such as severe difficulty breathing, persistent chest Medina, confusion. Discussed  that patient can follow up if their symptoms do not improve in the coming weeks.   Orders Placed This Encounter  Procedures   Novel Coronavirus, NAA ([facility])    Order Specific Question:   Previously tested for COVID-19    Answer:   No    Order Specific Question:   Resident in a congregate (group) care setting    Answer:   No    Order Specific Question:   Is the patient student?    Answer:   No    Order Specific Question:   Employed in healthcare setting    Answer:   No    Order Specific Question:   Has patient completed COVID vaccination(s) (2 doses of Pfizer/Moderna 1 dose of [employer])    Answer:   Yes    Order Specific Question:   Has patient completed COVID Booster / 3rd dose    Answer:   No   RSV Ag, Immunochr, Waived   Veritor Flu A/B Waived    Order Specific Question:   Source    Answer:   nasal   Meds ordered this encounter  Medications   oseltamivir (TAMIFLU) 75 MG capsule     Sig: Take 1 capsule (75 mg total) by mouth 2 (two) times daily for 5 days.    Dispense:  10 capsule    Refill:  0   benzonatate (TESSALON PERLES) 100 MG capsule    Sig: Take 1 capsule (100 mg total) by mouth 3 (three) times daily as needed for cough.    Dispense:  20 capsule    Refill:  0   promethazine-dextromethorphan (PROMETHAZINE-DM) 6.25-15 MG/5ML syrup    Sig: Take 2.5 mLs by mouth 4 (four) times daily as needed for cough.    Dispense:  118 mL    Refill:  0   methylPREDNISolone acetate (DEPO-MEDROL) injection 40 mg   Stephani Police, MS3

## 2022-07-24 LAB — NOVEL CORONAVIRUS, NAA: SARS-CoV-2, NAA: NOT DETECTED

## 2022-09-15 DIAGNOSIS — I10 Essential (primary) hypertension: Secondary | ICD-10-CM | POA: Diagnosis not present

## 2022-10-29 ENCOUNTER — Telehealth: Payer: Self-pay | Admitting: Family Medicine

## 2022-10-29 NOTE — Telephone Encounter (Signed)
  Prescription Request  10/29/2022  Is this a "Controlled Substance" medicine?   Have you seen your PCP in the last 2 weeks? Appt made for first available 5/29   If YES, route message to pool  -  If NO, patient needs to be scheduled for appointment.  What is the name of the medication or equipment? losartan  Have you contacted your pharmacy to request a refill? yes   Which pharmacy would you like this sent to? [facility] in [place]    Patient notified that their request is being sent to the clinical staff for review and that they should receive a response within 2 business days.

## 2022-10-30 NOTE — Telephone Encounter (Signed)
Since I haven't seen him in three years, he will need appointment before anything can be prescribed, even if it is a few weeks from now.

## 2022-10-30 NOTE — Telephone Encounter (Signed)
NA/VM full need to clarify what medication he is needing RF'd, Losartan is not on his current med list.  Dr. Darlyn Read, Losartan is under Reconciled meds Last OV for chronic ckup 07/19/19 . Next OV 12/10/22 for med refill was first available front staff was able to do Please advise

## 2022-10-30 NOTE — Telephone Encounter (Signed)
NA/VM full unable to refill, not on current med list, will have to wait to OV.

## 2022-11-03 ENCOUNTER — Encounter: Payer: Self-pay | Admitting: Family Medicine

## 2022-11-03 ENCOUNTER — Ambulatory Visit: Payer: BC Managed Care – PPO | Admitting: Family Medicine

## 2022-12-10 ENCOUNTER — Ambulatory Visit: Payer: BC Managed Care – PPO | Admitting: Family Medicine
